# Patient Record
Sex: Female | Born: 1985 | Race: White | Hispanic: No | Marital: Married | Smoking: Never smoker
Health system: Southern US, Community
[De-identification: ages and names within clinical notes are randomized; demographics above are authoritative.]

## PROBLEM LIST (undated history)

## (undated) DIAGNOSIS — F419 Anxiety disorder, unspecified: Secondary | ICD-10-CM

---
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
## 2015-08-22 ENCOUNTER — Ambulatory Visit (INDEPENDENT_AMBULATORY_CARE_PROVIDER_SITE_OTHER): Payer: BLUE CROSS/BLUE SHIELD | Admitting: Physician Assistant

## 2015-08-22 ENCOUNTER — Ambulatory Visit (INDEPENDENT_AMBULATORY_CARE_PROVIDER_SITE_OTHER): Payer: BLUE CROSS/BLUE SHIELD

## 2015-08-22 VITALS — BP 150/94 | HR 80 | Temp 98.9°F | Resp 16 | Ht 63.0 in

## 2015-08-22 DIAGNOSIS — S92302A Fracture of unspecified metatarsal bone(s), left foot, initial encounter for closed fracture: Secondary | ICD-10-CM

## 2015-08-22 DIAGNOSIS — M25572 Pain in left ankle and joints of left foot: Secondary | ICD-10-CM | POA: Diagnosis not present

## 2015-08-22 MED ORDER — HYDROCODONE-ACETAMINOPHEN 5-325 MG PO TABS: 1.0000 | ORAL_TABLET | Freq: Four times a day (QID) | ORAL | Status: DC | PRN

## 2015-08-22 NOTE — Patient Instructions (Addendum)
  We will set you up with ortho   IF you received an x-ray today, you will receive an invoice from [facility]. Please contact [facility] at [phone] with questions or concerns regarding your invoice.   IF you received labwork today, you will receive an invoice from [facility]. Please contact [facility] at [phone] with questions or concerns regarding your invoice.   Our billing staff will not be able to assist you with questions regarding bills from these companies.  You will be contacted with the lab results as soon as they are available. The fastest way to get your results is to activate your My Chart account. Instructions are located on the last page of this paperwork. If you have not heard from us regarding the results in 2 weeks, please contact this office.

## 2015-08-22 NOTE — Progress Notes (Signed)
   Heidi Hardin  MRN: [id number] DOB: 1986-05-12  Subjective:  Pt presents to clinic with left foot pain that started about 3 days ago when she twisted her foot.  She was treating with ice and motrin and thought she would get better but she actually feels like the swelling and bruising and definitely the pain is getting worse.  She has been keeping it wrapped and using crutches.  There are no active problems to display for this patient.   No current outpatient prescriptions on file prior to visit.   No current facility-administered medications on file prior to visit.    No Known Allergies  Review of Systems  Musculoskeletal: Positive for gait problem (2nd to pain).   Objective:  BP 150/94 mmHg  Pulse 80  Temp(Src) 98.9 F (37.2 C) (Oral)  Resp 16  Ht 5\' 3"  (1.6 m)  SpO2 97%  LMP 08/01/2015  Physical Exam  Constitutional: She is oriented to person, place, and time and well-developed, well-nourished, and in no distress.  HENT:  Head: Normocephalic and atraumatic.  Right Ear: Hearing and external ear normal.  Left Ear: Hearing and external ear normal.  Eyes: Conjunctivae are normal.  Neck: Normal range of motion.  Pulmonary/Chest: Effort normal.  Musculoskeletal:       Right foot: Normal.       Left foot: There is decreased range of motion, tenderness (lateral mid foot), bony tenderness and swelling (mid foot).       Feet:  Neurological: She is alert and oriented to person, place, and time. Gait normal.  Skin: Skin is warm and dry.  Psychiatric: Mood, memory, affect and judgment normal.  Vitals reviewed.  Dg Foot Complete Left  08/22/2015  CLINICAL DATA:  Rolled foot while walking, left foot pain and swelling EXAM: LEFT FOOT - COMPLETE 3+ VIEW COMPARISON:  None. FINDINGS: Three views of the left foot submitted. There is small avulsion fracture medial aspect at the base of first metatarsal. There is transverse nondisplaced fracture proximal shaft second, third and fourth  metatarsal. IMPRESSION: Small avulsion fracture medial aspect at the base of first metatarsal. Transverse nondisplaced fracture proximal shaft second, third and fourth metatarsal. Electronically Signed   By: Natasha MeadLiviu  Pop M.D.   On: 08/22/2015 17:59    Assessment and Plan :  Pain in joint, ankle and foot, left - Plan: DG Foot Complete Left, Ambulatory referral to Orthopedic Surgery  Fracture of metatarsal, closed, left, initial encounter - Plan: HYDROcodone-acetaminophen (NORCO/VICODIN) 5-325 MG tablet, Ambulatory referral to Orthopedic Surgery  Pt has multiple metatarsal fracture and she needs ortho evaluation.  She was placed in a camwalker and instructed to continue with crutches and non weight bearing  Benny LennertSarah Weber PA-C  [facility] 08/22/2015 6:14 PM

## 2016-10-19 ENCOUNTER — Inpatient Hospital Stay (HOSPITAL_COMMUNITY)
Admission: AD | Admit: 2016-10-19 | Discharge: 2016-10-19 | Disposition: A | Discharge status: Home / Self Care | Payer: PRIVATE HEALTH INSURANCE | Source: Ambulatory Visit | Attending: Family Medicine | Admitting: Family Medicine

## 2016-10-19 ENCOUNTER — Encounter (HOSPITAL_COMMUNITY): Payer: Self-pay

## 2016-10-19 DIAGNOSIS — Z79899 Other long term (current) drug therapy: Secondary | ICD-10-CM | POA: Diagnosis not present

## 2016-10-19 DIAGNOSIS — R102 Pelvic and perineal pain: Secondary | ICD-10-CM | POA: Diagnosis not present

## 2016-10-19 LAB — WET PREP, GENITAL
CLUE CELLS WET PREP: NONE SEEN
Sperm: NONE SEEN
TRICH WET PREP: NONE SEEN
YEAST WET PREP: NONE SEEN

## 2016-10-19 LAB — POCT PREGNANCY, URINE: PREG TEST UR: NEGATIVE

## 2016-10-19 LAB — CBC
HEMATOCRIT: 42 % (ref 36.0–46.0)
Hemoglobin: 14 g/dL (ref 12.0–15.0)
MCH: 28.4 pg (ref 26.0–34.0)
MCHC: 33.3 g/dL (ref 30.0–36.0)
MCV: 85.2 fL (ref 78.0–100.0)
PLATELETS: 219 10*3/uL (ref 150–400)
RBC: 4.93 MIL/uL (ref 3.87–5.11)
RDW: 13.9 % (ref 11.5–15.5)
WBC: 10.2 10*3/uL (ref 4.0–10.5)

## 2016-10-19 LAB — URINALYSIS, ROUTINE W REFLEX MICROSCOPIC
Bilirubin Urine: NEGATIVE
Glucose, UA: NEGATIVE mg/dL
Hgb urine dipstick: NEGATIVE
KETONES UR: NEGATIVE mg/dL
Leukocytes, UA: NEGATIVE
Nitrite: NEGATIVE
PROTEIN: NEGATIVE mg/dL
Specific Gravity, Urine: 1.01 (ref 1.005–1.030)
pH: 5 (ref 5.0–8.0)

## 2016-10-19 NOTE — Discharge Instructions (Signed)
Pelvic Pain, Female Pelvic pain is pain in your lower abdomen, below your belly button and between your hips. The pain may start suddenly (acute), keep coming back (recurring), or last a long time (chronic). Pelvic pain that lasts longer than six months is considered chronic. Pelvic pain may affect your:  Reproductive organs.  Urinary system.  Digestive tract.  Musculoskeletal system. There are many potential causes of pelvic pain. Sometimes, the pain can be a result of digestive or urinary conditions, strained muscles or ligaments, or even reproductive conditions. Sometimes the cause of pelvic pain is not known. Follow these instructions at home:  Take over-the-counter and prescription medicines only as told by your health care provider.  Rest as told by your health care provider.  Do not have sex it if hurts.  Keep a journal of your pelvic pain. Write down:  When the pain started.  Where the pain is located.  What seems to make the pain better or worse, such as food or your menstrual cycle.  Any symptoms you have along with the pain.  Keep all follow-up visits as told by your health care provider. This is important. Contact a health care provider if:  Medicine does not help your pain.  Your pain comes back.  You have new symptoms.  You have abnormal vaginal discharge or bleeding, including bleeding after menopause.  You have a fever or chills.  You are constipated.  You have blood in your urine or stool.  You have foul-smelling urine.  You feel weak or lightheaded. Get help right away if:  You have sudden severe pain.  Your pain gets steadily worse.  You have severe pain along with fever, nausea, vomiting, or excessive sweating.  You lose consciousness. This information is not intended to replace advice given to you by your health care provider. Make sure you discuss any questions you have with your health care provider. Document Released: 04/13/2004  Document Revised: 06/11/2015 Document Reviewed: 03/07/2015 Elsevier Interactive Patient Education  2017 [employer] Inc[employer]  

## 2016-10-19 NOTE — MAU Note (Signed)
Since 0630 this morning, has had sharp pains in RLQ, lasts about 15-3320min, then goes away.  Has happened about every 3 hrs

## 2016-10-19 NOTE — MAU Provider Note (Signed)
History     CSN: [id number]  Arrival date and time: 10/19/16 1813   First Provider Initiated Contact with Patient 10/19/16 1851     31 y.o non-pregnant female here with RLQ pain. Pain started this am. Describes as crampy then becomes sharp and lasts 20 min then resolves. Feels low in pelvic region. It has occurred about every 3 hrs today. She has not used anything for it. Lying down improves pain. Rates 9/10 when it occurs. No fevers. No urinary sx. No vaginal discharge or bleeding. Last BM was yesterday. LMP was about 3 mos ago. No hx of STIs. She is not using contraception and would be ok with a pregnancy.   Past Medical History:  Diagnosis Date  . Medical history non-contributory     Past Surgical History:  Procedure Laterality Date  . MOUTH SURGERY  2000    Family History  Problem Relation Age of Onset  . Cancer Father     Social History  Substance Use Topics  . Smoking status: Never Smoker  . Smokeless tobacco: Never Used  . Alcohol use No    Allergies: No Known Allergies  Prescriptions Prior to Admission  Medication Sig Dispense Refill Last Dose  . ALPRAZolam (XANAX) 0.25 MG tablet Take 0.25 mg by mouth at bedtime as needed for anxiety.     Marland Kitchen. HYDROcodone-acetaminophen (NORCO/VICODIN) 5-325 MG tablet Take 1 tablet by mouth every 6 (six) hours as needed for moderate pain. 30 tablet 0   . sertraline (ZOLOFT) 50 MG tablet Take 75 mg by mouth daily.  0     Review of Systems  Constitutional: Negative for fever.  Gastrointestinal: Positive for abdominal pain (RLQ). Negative for constipation, diarrhea, nausea and vomiting.  Genitourinary: Negative for dysuria, frequency, urgency, vaginal bleeding and vaginal discharge.   Physical Exam   Blood pressure 131/82, pulse 60, temperature 98.4 F (36.9 C), temperature source Oral, resp. rate 18, weight 78 kg (172 lb), last menstrual period 09/28/2016, SpO2 100 %.  Physical Exam  Nursing note and vitals  reviewed. Constitutional: She is oriented to person, place, and time. She appears well-developed and well-nourished. No distress (appears comfortable).  Neck: Normal range of motion.  Cardiovascular: Normal rate.   Respiratory: Effort normal.  GI: Soft. She exhibits no distension and no mass. There is no tenderness. There is no rebound and no guarding.  Genitourinary:  Genitourinary Comments: External: no lesions or erythema Vagina: rugated, nulli, thin white discharge Uterus: non enlarged, anteverted, non tender, no CMT Adnexae: no masses, no tenderness left, no tenderness right   Musculoskeletal: Normal range of motion.  Neurological: She is alert and oriented to person, place, and time.  Skin: Skin is warm and dry.  Psychiatric: She has a normal mood and affect.   Results for orders placed or performed during the hospital encounter of 10/19/16 (from the past 24 hour(s))  Urinalysis, Routine w reflex microscopic     Status: Abnormal   Collection Time: 10/19/16  6:42 PM  Result Value Ref Range   Color, Urine YELLOW YELLOW   APPearance CLEAR CLEAR   Specific Gravity, Urine 1.010 1.005 - 1.030   pH 5.0 5.0 - 8.0   Glucose, UA NEGATIVE NEGATIVE mg/dL   Hgb urine dipstick NEGATIVE NEGATIVE   Bilirubin Urine NEGATIVE NEGATIVE   Ketones, ur NEGATIVE NEGATIVE mg/dL   Protein, ur NEGATIVE NEGATIVE mg/dL   Nitrite NEGATIVE NEGATIVE   Leukocytes, UA NEGATIVE NEGATIVE   RBC / HPF 0-5 0 - 5 RBC/hpf  WBC, UA 0-5 0 - 5 WBC/hpf   Bacteria, UA MANY (A) NONE SEEN   Squamous Epithelial / LPF 0-5 (A) NONE SEEN   Mucous PRESENT   Pregnancy, urine POC     Status: None   Collection Time: 10/19/16  6:45 PM  Result Value Ref Range   Preg Test, Ur NEGATIVE NEGATIVE  Wet prep, genital     Status: Abnormal   Collection Time: 10/19/16  7:00 PM  Result Value Ref Range   Yeast Wet Prep HPF POC NONE SEEN NONE SEEN   Trich, Wet Prep NONE SEEN NONE SEEN   Clue Cells Wet Prep HPF POC NONE SEEN NONE  SEEN   WBC, Wet Prep HPF POC FEW (A) NONE SEEN   Sperm NONE SEEN   CBC     Status: None   Collection Time: 10/19/16  7:14 PM  Result Value Ref Range   WBC 10.2 4.0 - 10.5 K/uL   RBC 4.93 3.87 - 5.11 MIL/uL   Hemoglobin 14.0 12.0 - 15.0 g/dL   HCT 40.9 81.1 - 91.4 %   MCV 85.2 78.0 - 100.0 fL   MCH 28.4 26.0 - 34.0 pg   MCHC 33.3 30.0 - 36.0 g/dL   RDW 78.2 95.6 - 21.3 %   Platelets 219 150 - 400 K/uL   MAU Course  Procedures Declines pain meds  MDM Labs ordered and reviewed. No evidence of acute pelvic or abdominal process. Pain could have GI etiology, could be caused by small ovarian cysts that are undetectable on exam, or could be r/t impending menstrual cycle. She is stable for discharge home.  Assessment and Plan   1. Pelvic pain in female    Discharge home Follow up with GYN provider of choice to establish care Aleve prn Return for worsening sx Start PNV or Folic acid 1mg  daily in event of pregnancy  Allergies as of 10/19/2016   No Known Allergies     Medication List    TAKE these medications   ALPRAZolam 0.25 MG tablet Commonly known as:  XANAX Take 0.25 mg by mouth at bedtime as needed for anxiety.   buPROPion 150 MG 12 hr tablet Commonly known as:  WELLBUTRIN SR Take 150 mg by mouth daily.      Donette Larry, CNM 10/19/2016, 7:08 PM

## 2016-10-20 LAB — GC/CHLAMYDIA PROBE AMP (~~LOC~~) NOT AT ARMC
CHLAMYDIA, DNA PROBE: NEGATIVE
NEISSERIA GONORRHEA: NEGATIVE

## 2017-01-19 ENCOUNTER — Emergency Department (HOSPITAL_COMMUNITY)
Admission: EM | Admit: 2017-01-19 | Discharge: 2017-01-19 | Disposition: A | Discharge status: Home / Self Care | Payer: PRIVATE HEALTH INSURANCE | Attending: Emergency Medicine | Admitting: Emergency Medicine

## 2017-01-19 ENCOUNTER — Emergency Department (HOSPITAL_COMMUNITY): Payer: PRIVATE HEALTH INSURANCE

## 2017-01-19 ENCOUNTER — Encounter (HOSPITAL_COMMUNITY): Payer: Self-pay | Admitting: Emergency Medicine

## 2017-01-19 DIAGNOSIS — Z23 Encounter for immunization: Secondary | ICD-10-CM | POA: Insufficient documentation

## 2017-01-19 DIAGNOSIS — Y999 Unspecified external cause status: Secondary | ICD-10-CM | POA: Diagnosis not present

## 2017-01-19 DIAGNOSIS — S61212A Laceration without foreign body of right middle finger without damage to nail, initial encounter: Secondary | ICD-10-CM | POA: Diagnosis present

## 2017-01-19 DIAGNOSIS — Z79899 Other long term (current) drug therapy: Secondary | ICD-10-CM | POA: Insufficient documentation

## 2017-01-19 DIAGNOSIS — W268XXA Contact with other sharp object(s), not elsewhere classified, initial encounter: Secondary | ICD-10-CM | POA: Insufficient documentation

## 2017-01-19 DIAGNOSIS — Y929 Unspecified place or not applicable: Secondary | ICD-10-CM | POA: Insufficient documentation

## 2017-01-19 DIAGNOSIS — Y93G1 Activity, food preparation and clean up: Secondary | ICD-10-CM | POA: Insufficient documentation

## 2017-01-19 MED ORDER — BACITRACIN ZINC 500 UNIT/GM EX OINT
1.0000 "application " | TOPICAL_OINTMENT | Freq: Once | CUTANEOUS | Status: AC
  Administered 2017-01-19: 1 via TOPICAL
  Filled 2017-01-19: qty 0.9

## 2017-01-19 MED ORDER — TETANUS-DIPHTH-ACELL PERTUSSIS 5-2.5-18.5 LF-MCG/0.5 IM SUSP
0.5000 mL | Freq: Once | INTRAMUSCULAR | Status: AC
  Administered 2017-01-19: 0.5 mL via INTRAMUSCULAR
  Filled 2017-01-19: qty 0.5

## 2017-01-19 MED ORDER — LIDOCAINE HCL (PF) 1 % IJ SOLN
10.0000 mL | Freq: Once | INTRAMUSCULAR | Status: AC
  Administered 2017-01-19: 10 mL via INTRADERMAL
  Filled 2017-01-19: qty 30

## 2017-01-19 NOTE — Discharge Instructions (Signed)
Used the finger splint for the first 2-3 days.  Keep wound dry and do not remove dressing for 24 hours if possible. After that, wash gently morning and night (every 12 hours) with soap and water. Use a topical antibiotic ointment and cover with a bandaid or gauze.    Do NOT use rubbing alcohol or hydrogen peroxide, do not soak the area   Present to your primary care doctor or the urgent care of your choice, or the ED for suture removal in 7-8 days.   Every attempt was made to remove foreign body (contaminants) from the wound.  However, there is always a chance that some may remain in the wound. This can  increase your risk of infection.   If you see signs of infection (warmth, redness, tenderness, pus, sharp increase in pain, fever, red streaking in the skin) immediately return to the emergency department.   After the wound heals fully, apply sunscreen for 6-12 months to minimize scarring.

## 2017-01-19 NOTE — ED Notes (Signed)
Applied finger with saline gauze and wrapped it.

## 2017-01-19 NOTE — ED Provider Notes (Signed)
[facility]-EMERGENCY DEPT Provider Note   CSN: [id number] Arrival date & time: 01/19/17  6295     History   Chief Complaint Chief Complaint  Patient presents with  . Extremity Laceration    HPI  Blood pressure (!) 132/97, pulse 87, temperature 98.1 F (36.7 C), temperature source Oral, resp. rate 16, height 5\' 3"  (1.6 m), weight 72.6 kg (160 lb), last menstrual period 01/18/2017, SpO2 100 %.  Heidi Hardin is a 31 y.o. female complaining of  Laceration to right middle digit PIP occurring just prior to arrival when patient inserted her hand into her blender while she was making a smoothie earlier in the morning. The bladder was not on. She is unsure of her last tetanus shot date. 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 Medical history non-contributory     There are no active problems to display for this patient.   Past Surgical History:  Procedure Laterality Date  . MOUTH SURGERY  2000    OB History    No data available       Home Medications    Prior to Admission medications   Medication Sig Start Date End Date Taking? Authorizing Provider  ALPRAZolam Prudy Feeler) 0.25 MG tablet Take 0.25 mg by mouth at bedtime as needed for anxiety.    [provider]  buPROPion (WELLBUTRIN SR) 150 MG 12 hr tablet Take 150 mg by mouth daily. 09/19/16   [provider]    Family History Family History  Problem Relation Age of Onset  . Cancer Father     Social History Social History  Substance Use Topics  . Smoking status: Never Smoker  . Smokeless tobacco: Never Used  . Alcohol use No     Allergies   Patient has no known allergies.   Review of Systems Review of Systems  A complete review of systems was obtained and all systems are negative except as  noted in the HPI and PMH.   Physical Exam Updated Vital Signs BP (!) 132/97 (BP Location: Left Arm)   Pulse 87   Temp 98.1 F (36.7 C) (Oral)   Resp 16   Ht 5\' 3"  (1.6 m)   Wt 72.6 kg (160 lb)   LMP 01/18/2017   SpO2 100%   BMI 28.34 kg/m   Physical Exam  Constitutional: She is oriented to person, place, and time. She appears well-developed and well-nourished. No distress.  HENT:  Head: Normocephalic and atraumatic.  Mouth/Throat: Oropharynx is clear and moist.  Eyes: Pupils are equal, round, and reactive to light. Conjunctivae and EOM are normal.  Neck: Normal range of motion.  Cardiovascular: Normal rate, regular rhythm and intact distal pulses.   Pulmonary/Chest: Effort normal and breath sounds normal.  Abdominal: Soft. There is no tenderness.  Musculoskeletal: Normal range of motion.  Neurological: She is alert and oriented to person, place, and time.  Skin: She is not diaphoretic.  1 cm full-thickness laceration to right middle digit PIP on the dorsal aspect. Neurovacularly intact with full ROM and strength to each interphalangeal joint (tested in isolation) in both flexion and extension.    Psychiatric: She has a normal mood and affect.  Nursing note and vitals reviewed.    ED Treatments / Results  Labs (all labs ordered are listed, but only abnormal results  are displayed) Labs Reviewed - No data to display  EKG  EKG Interpretation None       Radiology Dg Finger Index Right  Result Date: 01/19/2017 CLINICAL DATA:  Finger caught bladder, initial encounter EXAM: RIGHT THIRD FINGER COMPARISON:  None. FINDINGS: There is no evidence of fracture or dislocation. There is no evidence of arthropathy or other focal bone abnormality. Soft tissues are unremarkable. IMPRESSION: No acute abnormality noted. Electronically Signed   By: Alcide Clever M.D.   On: 01/19/2017 07:21    Procedures .Marland KitchenLaceration Repair Date/Time: 01/19/2017 8:47 AM Performed by: Wynetta Emery Authorized by: Wynetta Emery   Consent:    Consent obtained:  Verbal   Alternatives discussed:  No treatment Anesthesia (see MAR for exact dosages):    Anesthesia method:  Nerve block   Block needle gauge:  25 G   Block anesthetic:  Lidocaine 1% w/o epi   Block injection procedure:  Anatomic landmarks identified   Block outcome:  Anesthesia achieved Laceration details:    Location:  Finger   Finger location:  R long finger Repair type:    Repair type:  Simple Pre-procedure details:    Preparation:  Patient was prepped and draped in usual sterile fashion Exploration:    Wound exploration: wound explored through full range of motion and entire depth of wound probed and visualized     Contaminated: no   Treatment:    Area cleansed with:  Saline   Amount of cleaning:  Standard   Irrigation solution:  Sterile water and sterile saline   Irrigation volume:  500cc   Irrigation method:  Syringe   Visualized foreign bodies/material removed: no   Skin repair:    Repair method:  Sutures   Suture size:  6-0   Wound skin closure material used: ethylon.   Suture technique:  Simple interrupted   Number of sutures:  3 Approximation:    Approximation:  Close   Vermilion border: well-aligned   Post-procedure details:    Dressing:  Antibiotic ointment and splint for protection   Patient tolerance of procedure:  Tolerated well, no immediate complications   (including critical care time)  Medications Ordered in ED Medications  bacitracin ointment 1 application (not administered)  lidocaine (PF) (XYLOCAINE) 1 % injection 10 mL (10 mLs Intradermal Given 01/19/17 0814)  Tdap (BOOSTRIX) injection 0.5 mL (0.5 mLs Intramuscular Given 01/19/17 0811)     Initial Impression / Assessment and Plan / ED Course  I have reviewed the triage vital signs and the nursing notes.  Pertinent labs & imaging results that were available during my care of the patient were reviewed by me and  considered in my medical decision making (see chart for details).     Vitals:   01/19/17 0659  BP: (!) 132/97  Pulse: 87  Resp: 16  Temp: 98.1 F (36.7 C)  TempSrc: Oral  SpO2: 100%  Weight: 72.6 kg (160 lb)  Height: 5\' 3"  (1.6 m)    Medications  bacitracin ointment 1 application (not administered)  lidocaine (PF) (XYLOCAINE) 1 % injection 10 mL (10 mLs Intradermal Given 01/19/17 0814)  Tdap (BOOSTRIX) injection 0.5 mL (0.5 mLs Intramuscular Given 01/19/17 0811)    Ahlana Slaydon is 31 y.o. female presenting with finger laceration.  No signs of tendon/joint involvement. Tdap booster given. Pressure irrigation performed. Laceration occurred < 8 hours prior to repair which was well tolerated. Pt has no co morbidities to effect normal wound healing. Discussed suture home care w pt  and answered questions. Pt to f-u for wound check and suture removal in 7 days. Pt is hemodynamically stable with no complaints prior to dc.   Evaluation does not show pathology that would require ongoing emergent intervention or inpatient treatment. Pt is hemodynamically stable and mentating appropriately. Discussed findings and plan with patient/guardian, who agrees with care plan. All questions answered. Return precautions discussed and outpatient follow up given.    Final Clinical Impressions(s) / ED Diagnoses   Final diagnoses:  Laceration of right middle finger without foreign body without damage to nail, initial encounter    New Prescriptions New Prescriptions   No medications on file     Kaylyn Lim 01/19/17 0850    Lorre Nick, MD 01/19/17 [phone]

## 2017-01-19 NOTE — ED Triage Notes (Signed)
Patient sliced her finger when making a smoothie around 20 minutes ago. Patient is having 1/10 pain. Patient had gauze around it. Gauze had moderate amount on the gauze.

## 2017-02-16 ENCOUNTER — Inpatient Hospital Stay (EMERGENCY_DEPARTMENT_HOSPITAL)
Admission: AD | Admit: 2017-02-16 | Discharge: 2017-02-16 | Disposition: A | Discharge status: Home / Self Care | Payer: PRIVATE HEALTH INSURANCE | Source: Ambulatory Visit | Attending: Obstetrics & Gynecology | Admitting: Obstetrics & Gynecology

## 2017-02-16 ENCOUNTER — Emergency Department (HOSPITAL_COMMUNITY)
Admission: EM | Admit: 2017-02-16 | Discharge: 2017-02-16 | Disposition: A | Discharge status: Home / Self Care | Payer: PRIVATE HEALTH INSURANCE | Source: Home / Self Care | Attending: Emergency Medicine | Admitting: Emergency Medicine

## 2017-02-16 ENCOUNTER — Emergency Department (HOSPITAL_COMMUNITY): Payer: PRIVATE HEALTH INSURANCE

## 2017-02-16 ENCOUNTER — Encounter (HOSPITAL_COMMUNITY): Payer: Self-pay | Admitting: Emergency Medicine

## 2017-02-16 ENCOUNTER — Encounter (HOSPITAL_COMMUNITY): Payer: Self-pay

## 2017-02-16 DIAGNOSIS — N839 Noninflammatory disorder of ovary, fallopian tube and broad ligament, unspecified: Secondary | ICD-10-CM | POA: Diagnosis not present

## 2017-02-16 DIAGNOSIS — N83511 Torsion of right ovary and ovarian pedicle: Secondary | ICD-10-CM | POA: Diagnosis not present

## 2017-02-16 DIAGNOSIS — N838 Other noninflammatory disorders of ovary, fallopian tube and broad ligament: Secondary | ICD-10-CM

## 2017-02-16 DIAGNOSIS — R102 Pelvic and perineal pain: Secondary | ICD-10-CM | POA: Diagnosis not present

## 2017-02-16 DIAGNOSIS — Z79899 Other long term (current) drug therapy: Secondary | ICD-10-CM

## 2017-02-16 DIAGNOSIS — O0281 Inappropriate change in quantitative human chorionic gonadotropin (hCG) in early pregnancy: Secondary | ICD-10-CM | POA: Insufficient documentation

## 2017-02-16 DIAGNOSIS — R1031 Right lower quadrant pain: Secondary | ICD-10-CM

## 2017-02-16 HISTORY — DX: Anxiety disorder, unspecified: F41.9

## 2017-02-16 LAB — URINALYSIS, ROUTINE W REFLEX MICROSCOPIC
BILIRUBIN URINE: NEGATIVE
Bacteria, UA: NONE SEEN
GLUCOSE, UA: NEGATIVE mg/dL
KETONES UR: 80 mg/dL — AB
LEUKOCYTES UA: NEGATIVE
Nitrite: NEGATIVE
PH: 7 (ref 5.0–8.0)
PROTEIN: NEGATIVE mg/dL
Specific Gravity, Urine: 1.013 (ref 1.005–1.030)

## 2017-02-16 LAB — COMPREHENSIVE METABOLIC PANEL
ALT: 17 U/L (ref 14–54)
ANION GAP: 9 (ref 5–15)
AST: 21 U/L (ref 15–41)
Albumin: 4.5 g/dL (ref 3.5–5.0)
Alkaline Phosphatase: 67 U/L (ref 38–126)
BUN: 7 mg/dL (ref 6–20)
CHLORIDE: 107 mmol/L (ref 101–111)
CO2: 23 mmol/L (ref 22–32)
Calcium: 9.5 mg/dL (ref 8.9–10.3)
Creatinine, Ser: 0.83 mg/dL (ref 0.44–1.00)
Glucose, Bld: 112 mg/dL — ABNORMAL HIGH (ref 65–99)
POTASSIUM: 3.9 mmol/L (ref 3.5–5.1)
SODIUM: 139 mmol/L (ref 135–145)
Total Bilirubin: 0.9 mg/dL (ref 0.3–1.2)
Total Protein: 7.5 g/dL (ref 6.5–8.1)

## 2017-02-16 LAB — CBC
HCT: 39.8 % (ref 36.0–46.0)
HEMOGLOBIN: 14 g/dL (ref 12.0–15.0)
MCH: 28.7 pg (ref 26.0–34.0)
MCHC: 35.2 g/dL (ref 30.0–36.0)
MCV: 81.6 fL (ref 78.0–100.0)
Platelets: 241 10*3/uL (ref 150–400)
RBC: 4.88 MIL/uL (ref 3.87–5.11)
RDW: 13 % (ref 11.5–15.5)
WBC: 16.2 10*3/uL — AB (ref 4.0–10.5)

## 2017-02-16 LAB — HCG, QUANTITATIVE, PREGNANCY

## 2017-02-16 LAB — LIPASE, BLOOD: Lipase: 29 U/L (ref 11–51)

## 2017-02-16 MED ORDER — MORPHINE SULFATE (PF) 4 MG/ML IV SOLN
4.0000 mg | Freq: Once | INTRAVENOUS | Status: AC
  Administered 2017-02-16: 4 mg via INTRAVENOUS
  Filled 2017-02-16: qty 1

## 2017-02-16 MED ORDER — IOPAMIDOL (ISOVUE-300) INJECTION 61%
100.0000 mL | Freq: Once | INTRAVENOUS | Status: AC | PRN
  Administered 2017-02-16: 100 mL via INTRAVENOUS

## 2017-02-16 MED ORDER — NAPROXEN 500 MG PO TABS: 500.0000 mg | ORAL_TABLET | Freq: Two times a day (BID) | ORAL | 0 refills | Status: DC

## 2017-02-16 MED ORDER — MORPHINE SULFATE (PF) 4 MG/ML IV SOLN
4.0000 mg | Freq: Once | INTRAVENOUS | Status: AC
Start: 2017-02-16 — End: 2017-02-16
  Administered 2017-02-16: 4 mg via INTRAVENOUS
  Filled 2017-02-16: qty 1

## 2017-02-16 MED ORDER — KETOROLAC TROMETHAMINE 30 MG/ML IJ SOLN
30.0000 mg | Freq: Once | INTRAMUSCULAR | Status: AC
  Administered 2017-02-16: 30 mg via INTRAVENOUS
  Filled 2017-02-16: qty 1

## 2017-02-16 MED ORDER — IOPAMIDOL (ISOVUE-300) INJECTION 61%
INTRAVENOUS | Status: AC
  Filled 2017-02-16: qty 100

## 2017-02-16 MED ORDER — ONDANSETRON HCL 4 MG/2ML IJ SOLN
4.0000 mg | Freq: Once | INTRAMUSCULAR | Status: AC | PRN
  Administered 2017-02-16: 4 mg via INTRAVENOUS
  Filled 2017-02-16 (×2): qty 2

## 2017-02-16 MED ORDER — OXYCODONE HCL 5 MG PO TABS: 5.0000 mg | ORAL_TABLET | ORAL | 0 refills | Status: DC | PRN

## 2017-02-16 MED ORDER — ACETAMINOPHEN 500 MG PO TABS
1000.0000 mg | ORAL_TABLET | Freq: Once | ORAL | Status: AC
  Administered 2017-02-16: 1000 mg via ORAL
  Filled 2017-02-16: qty 2

## 2017-02-16 MED ORDER — OXYCODONE HCL 5 MG PO TABS
5.0000 mg | ORAL_TABLET | ORAL | Status: AC
  Administered 2017-02-16: 5 mg via ORAL
  Filled 2017-02-16: qty 1

## 2017-02-16 MED ORDER — ACETAMINOPHEN 500 MG PO TABS: 1000.0000 mg | ORAL_TABLET | Freq: Four times a day (QID) | ORAL | 0 refills | Status: DC | PRN

## 2017-02-16 MED ORDER — ONDANSETRON HCL 4 MG/2ML IJ SOLN
4.0000 mg | Freq: Once | INTRAMUSCULAR | Status: AC
  Administered 2017-02-16: 4 mg via INTRAVENOUS

## 2017-02-16 MED ORDER — HYDROMORPHONE HCL 1 MG/ML IJ SOLN
1.0000 mg | Freq: Once | INTRAMUSCULAR | Status: AC
  Administered 2017-02-16: 1 mg via INTRAMUSCULAR
  Filled 2017-02-16: qty 1

## 2017-02-16 NOTE — ED Notes (Signed)
Patient has went to ultrasound.

## 2017-02-16 NOTE — ED Notes (Signed)
Patient has called out reporting pain is starting to come back. Informed Dr. Casimer Lanius and new orders obtained.

## 2017-02-16 NOTE — ED Notes (Signed)
Ultrasound has called to inform that patient is tearful and requesting additional pain medication. Informed Dr. Cordelia Poche of this information and new order obtained.

## 2017-02-16 NOTE — ED Triage Notes (Signed)
Patient presents with RLQ pain since 1930 last night. Pain has since progressed to severe this morning. Difficult to move. N/V. Denies diarrhea. Currently menstruating. No pain relief with Ibuprofen.

## 2017-02-16 NOTE — MAU Provider Note (Signed)
History     CSN: [id number]  Arrival date and time: 02/16/17 2013   None     Chief Complaint  Patient presents with  . Abdominal Pain   HPI Heidi Hardin is a 31 y.o. female who presents with abdominal pain. Was seen at [facility] today with same complaint & diagnosed with abdominal mass. CT & ultrasound show 11.3 cm right ovarian mass concerning for neoplasm. Patient states she was told to come here if pain worsened. States the pain is slowly coming back; currently rates pain 4/10. Denies fever/chills, n/v/d, constipation, or dysuria. Endorses vaginal bleeding consistent with her menses that started a few days ago.   Past Medical History:  Diagnosis Date  . Anxiety     Past Surgical History:  Procedure Laterality Date  . MOUTH SURGERY  2000    Family History  Problem Relation Age of Onset  . Cancer Father     Social History  Substance Use Topics  . Smoking status: Never Smoker  . Smokeless tobacco: Never Used  . Alcohol use No    Allergies: No Known Allergies  Prescriptions Prior to Admission  Medication Sig Dispense Refill Last Dose  . buPROPion (WELLBUTRIN SR) 150 MG 12 hr tablet Take 150 mg by mouth daily.  5 02/15/2017 at Unknown time  . acetaminophen (TYLENOL) 500 MG tablet Take 2 tablets (1,000 mg total) by mouth every 6 (six) hours as needed. 30 tablet 0   . ALPRAZolam (XANAX) 0.25 MG tablet Take 0.25 mg by mouth at bedtime as needed for anxiety.   Past Week at Unknown time  . naproxen (NAPROSYN) 500 MG tablet Take 1 tablet (500 mg total) by mouth 2 (two) times daily. 30 tablet 0   . oxyCODONE (ROXICODONE) 5 MG immediate release tablet Take 1 tablet (5 mg total) by mouth every 4 (four) hours as needed for severe pain. 25 tablet 0     Review of Systems  Constitutional: Negative.   Gastrointestinal: Positive for abdominal pain. Negative for constipation, diarrhea, nausea and vomiting.  Genitourinary: Positive for vaginal bleeding. Negative for dysuria and vaginal  discharge.   Physical Exam   Blood pressure 120/87, pulse 76, temperature 98.2 F (36.8 C), temperature source Oral, resp. rate 19, height  (1.6 m), weight 169 lb (76.7 kg), last menstrual period 02/14/2017, SpO2 97 %.  Physical Exam  Nursing note and vitals reviewed. Constitutional: She is oriented to person, place, and time. She appears well-developed and well-nourished. No distress.  HENT:  Head: Normocephalic and atraumatic.  Eyes: Conjunctivae are normal. Right eye exhibits no discharge. Left eye exhibits no discharge. No scleral icterus.  Neck: Normal range of motion.  Respiratory: Effort normal. No respiratory distress.  GI: Soft. Bowel sounds are normal. She exhibits mass (mid lower abdomen). There is tenderness in the suprapubic area. There is no rigidity, no rebound and no guarding.  Neurological: She is alert and oriented to person, place, and time.  Skin: Skin is warm and dry. She is not diaphoretic.  Psychiatric: She has a normal mood and affect. Her behavior is normal. Judgment and thought content normal.    MAU Course  Procedures US Pelvis Transvanginal Non-ob (tv Only)  Result Date: 02/16/2017 CLINICAL DATA:  Right lower quadrant pain since last night. Abnormal CT. EXAM: TRANSABDOMINAL AND TRANSVAGINAL ULTRASOUND OF PELVIS TECHNIQUE: Both transabdominal and transvaginal ultrasound examinations of the pelvis were performed. Transabdominal technique was performed for global imaging of the pelvis including uterus, ovaries, adnexal regions, and pelvic cul-de-sac.  It was necessary to proceed with endovaginal exam following the transabdominal exam to visualize the adnexal structures to an adequate degree. COMPARISON:  CT abdomen from earlier same day. FINDINGS: Uterus Measurements: 7.8 x 3.3 x 4.5 cm. No fibroids or other mass visualized. Endometrium Thickness: 11 mm.  No focal abnormality visualized. Right ovary Complex cystic and solid mass within the right adnexa extending  to the midline, measuring 11.6 x 7.6 x 10.8 cm, corresponding to the complex cystic and solid mass seen on earlier CT. A normal right ovary is not seen separate from this mass. Left ovary Measurements: 3 x 2.3 x 2.7 cm. Normal appearance/no adnexal mass. Other findings No abnormal free fluid. IMPRESSION: 1. Complex cystic and solid mass within the right adnexa extending to the midline, measuring 11.6 cm greatest dimension, corresponding to the complex cystic and solid mass seen above the uterus on CT exam performed earlier today. 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 Recommend GYN consultation for further workup considerations including possible laparoscopy. 2. Uterus and left ovary appear normal. Electronically Signed   By: Bary Richard M.D.   On: 02/16/2017 16:52   US Pelvis (transabdominal Only)  Result Date: 02/16/2017 CLINICAL DATA:  Right lower quadrant pain since last night. Abnormal CT. EXAM: TRANSABDOMINAL AND TRANSVAGINAL ULTRASOUND OF PELVIS TECHNIQUE: Both transabdominal and transvaginal ultrasound examinations of the pelvis were performed. Transabdominal technique was performed for global imaging of the pelvis including uterus, ovaries, adnexal regions, and pelvic cul-de-sac. It was necessary to proceed with endovaginal exam following the transabdominal exam to visualize the adnexal structures to an adequate degree. COMPARISON:  CT abdomen from earlier same day. FINDINGS: Uterus Measurements: 7.8 x 3.3 x 4.5 cm. No fibroids or other mass visualized. Endometrium Thickness: 11 mm.  No focal abnormality visualized. Right ovary Complex cystic and solid mass within the right adnexa extending to the midline, measuring 11.6 x 7.6 x 10.8 cm, corresponding to the complex cystic and solid mass seen on earlier CT. A normal right ovary is not seen separate from this mass. Left ovary Measurements: 3 x 2.3 x 2.7 cm. Normal appearance/no adnexal mass. Other findings  No abnormal free fluid. IMPRESSION: 1. Complex cystic and solid mass within the right adnexa extending to the midline, measuring 11.6 cm greatest dimension, corresponding to the complex cystic and solid mass seen above the uterus on CT exam performed earlier today. 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 Recommend GYN consultation for further workup considerations including possible laparoscopy. 2. Uterus and left ovary appear normal. Electronically Signed   By: Bary Richard M.D.   On: 02/16/2017 16:52   Ct Abdomen Pelvis W Contrast  Result Date: 02/16/2017 CLINICAL DATA:  Abdominal distention, right lower quadrant pain EXAM: CT ABDOMEN AND PELVIS WITH CONTRAST TECHNIQUE: Multidetector CT imaging of the abdomen and pelvis was performed using the standard protocol following bolus administration of intravenous contrast. CONTRAST:  ISOVUE-300 IOPAMIDOL (ISOVUE-300) INJECTION 61% COMPARISON:  None. FINDINGS: Lower chest: Lung bases are clear. No effusions. Heart is normal size. Hepatobiliary: No focal hepatic abnormality. Gallbladder unremarkable. Pancreas: No focal abnormality or ductal dilatation. Spleen: No focal abnormality.  Normal size. Adrenals/Urinary Tract: No adrenal abnormality. No focal renal abnormality. No stones or hydronephrosis. Urinary bladder is unremarkable. Stomach/Bowel: Colon is fluid-filled with air-fluid levels suggests the possibility of diarrhea. Normal appendix. Stomach and small bowel decompressed, unremarkable. Vascular/Lymphatic: No evidence of aneurysm or adenopathy. Reproductive: Uterus and left ovary unremarkable. There is a large  complex mixed solid and cystic mass in the midline of the pelvis measuring 11.2 x 10.3 x 8.2 cm. Legrand Rams this represents complex mass within the right ovary. Other: Small amount of free fluid in the pelvis.  No free air. Musculoskeletal: No acute bony abnormality. IMPRESSION: Large complex cystic and solid  mass in the midline of the pelvis measuring 11.2 x 10.3 x 8.2 cm. I favor this represents the right ovary. Cannot exclude cystic ovarian neoplasm, benign or malignant. Small amount of free fluid in the pelvis. Appendix is normal. Fluid filled prominent colon with air-fluid levels suggesting diarrhea. Electronically Signed   By: Charlett Nose M.D.   On: 02/16/2017 14:32   Results for orders placed or performed during the hospital encounter of 02/16/17 (from the past 24 hour(s))  Lipase, blood     Status: None   Collection Time: 02/16/17 12:33 PM  Result Value Ref Range   Lipase 29 11 - 51 U/L  Comprehensive metabolic panel     Status: Abnormal   Collection Time: 02/16/17 12:33 PM  Result Value Ref Range   Sodium 139 135 - 145 mmol/L   Potassium 3.9 3.5 - 5.1 mmol/L   Chloride 107 101 - 111 mmol/L   CO2 23 22 - 32 mmol/L   Glucose, Bld 112 (H) 65 - 99 mg/dL   BUN 7 6 - 20 mg/dL   Creatinine, Ser 6.96 0.44 - 1.00 mg/dL   Calcium 9.5 8.9 - 29.5 mg/dL   Total Protein 7.5 6.5 - 8.1 g/dL   Albumin 4.5 3.5 - 5.0 g/dL   AST 21 15 - 41 U/L   ALT 17 14 - 54 U/L   Alkaline Phosphatase 67 38 - 126 U/L   Total Bilirubin 0.9 0.3 - 1.2 mg/dL   GFR calc non Af Amer >60 >60 mL/min   GFR calc Af Amer >60 >60 mL/min   Anion gap 9 5 - 15  CBC     Status: Abnormal   Collection Time: 02/16/17 12:33 PM  Result Value Ref Range   WBC 16.2 (H) 4.0 - 10.5 K/uL   RBC 4.88 3.87 - 5.11 MIL/uL   Hemoglobin 14.0 12.0 - 15.0 g/dL   HCT 28.4 13.2 - 44.0 %   MCV 81.6 78.0 - 100.0 fL   MCH 28.7 26.0 - 34.0 pg   MCHC 35.2 30.0 - 36.0 g/dL   RDW 10.2 72.5 - 36.6 %   Platelets 241 150 - 400 K/uL  Urinalysis, Routine w reflex microscopic     Status: Abnormal   Collection Time: 02/16/17 12:33 PM  Result Value Ref Range   Color, Urine YELLOW YELLOW   APPearance CLEAR CLEAR   Specific Gravity, Urine 1.013 1.005 - 1.030   pH 7.0 5.0 - 8.0   Glucose, UA NEGATIVE NEGATIVE mg/dL   Hgb urine dipstick MODERATE (A)  NEGATIVE   Bilirubin Urine NEGATIVE NEGATIVE   Ketones, ur 80 (A) NEGATIVE mg/dL   Protein, ur NEGATIVE NEGATIVE mg/dL   Nitrite NEGATIVE NEGATIVE   Leukocytes, UA NEGATIVE NEGATIVE   RBC / HPF 0-5 0 - 5 RBC/hpf   WBC, UA 0-5 0 - 5 WBC/hpf   Bacteria, UA NONE SEEN NONE SEEN   Squamous Epithelial / LPF 0-5 (A) NONE SEEN   Mucus PRESENT   hCG, quantitative, pregnancy     Status: None   Collection Time: 02/16/17 12:53 PM  Result Value Ref Range   hCG, Beta Chain, Quant, S <1 <5 mIU/mL  MDM Negative BHCG at [facility] today. Patient had full work up at [facility] including labs, abdominal CT, & pelvic ultrasound. Reports mild increase in pain since being discharged; has not filled rx yet. No concern for torsion at this time. Discussed options with patient; will collect CA-125 today so it will be resulted for her f/u appointment. Pt declines vaginal swabs/cultures. I did not find it necessary to repeat pelvic exam at this time.   Assessment and Plan  A; 1. Ovarian mass, right    P: Discharge home CA-125 pending Msg to [facility] for f/u appt asap vs gyn/onc referral Discussed reasons to return to MAU including s/s ovarian torsion Take meds previously prescribed PRN pain  Judeth Horn 02/16/2017, 10:35 PM

## 2017-02-16 NOTE — ED Provider Notes (Signed)
Received care of patient at 4PM. Briefly this is a 31yo female who presents with RLQ abdominal pain. CT shows concern for right ovarian mass. [place] ordered and pending. Preg negative, no vaginal discharge, doubt TOA.   [place] results confirm presence of large cystic and solid ovarian mass.  Noted that dopplers not ordered on this [place], however clinically on my evaluation she appears comfortable and I have a low suspicion for torsion at this time.  Discussed with patient that she will require close OBGYN follow up, to call the office tomorrow to set up appointment per Dr. Forestine Chute GYN recommendations.  Discussed strict return precautions, discussing that if she develops acute severe writhing pain this may be concerning for torsion, and she should proceed to any ED however [facility] appropriate if torsion is concern.  She states understanding. Provided rx for oxycodone, naproxen, tylenol, discussed risks of narcotic medications in detail. Patient discharged in stable condition with understanding of reasons to return.    Heidi Monday, MD 02/17/17 1322

## 2017-02-16 NOTE — ED Notes (Signed)
Patient transported to CT 

## 2017-02-16 NOTE — MAU Note (Addendum)
Pt states she just left [facility]. States they did an ultrasound and it was not the correct one and was told to come here. Was told that she has a cyst on right ovary. States she has been in pain since 7:30pm. States the pain is throbbing and sharp. Rates 3/10 at this time. Pt states she is currently on her period.

## 2017-02-16 NOTE — ED Provider Notes (Signed)
[facility]-EMERGENCY DEPT Provider Note   CSN: [id number] Arrival date & time: 02/16/17  1112     History   Chief Complaint Chief Complaint  Patient presents with  . Abdominal Pain    HPI Heidi Hardin is a 31 y.o. female.  31 year old female presents with a 24-hour history of right lower quadrant abdominal pain without radiation to her flank. Denies any fever or chills. No urinary symptoms. She is on her menstrual cycle at this time. Pain is been persistent and worse with any movement. No prior history of same. No treatment use prior to arrival.      Past Medical History:  Diagnosis Date  . Medical history non-contributory     There are no active problems to display for this patient.   Past Surgical History:  Procedure Laterality Date  . MOUTH SURGERY  2000    OB History    No data available       Home Medications    Prior to Admission medications   Medication Sig Start Date End Date Taking? Authorizing Provider  ALPRAZolam Prudy Feeler) 0.25 MG tablet Take 0.25 mg by mouth at bedtime as needed for anxiety.    [provider]  buPROPion (WELLBUTRIN SR) 150 MG 12 hr tablet Take 150 mg by mouth daily. 09/19/16   [provider]    Family History Family History  Problem Relation Age of Onset  . Cancer Father     Social History Social History  Substance Use Topics  . Smoking status: Never Smoker  . Smokeless tobacco: Never Used  . Alcohol use No     Allergies   Patient has no known allergies.   Review of Systems Review of Systems  All other systems reviewed and are negative.    Physical Exam Updated Vital Signs BP (!) 142/95   Pulse (!) 45   Temp 97.6 F (36.4 C)   Resp 16   LMP 01/18/2017   SpO2 100%   Physical Exam  Constitutional: She is oriented to person, place, and time. She appears well-developed and well-nourished.  Non-toxic appearance. No distress.  HENT:  Head: Normocephalic and atraumatic.  Eyes: Pupils are equal,  round, and reactive to light. Conjunctivae, EOM and lids are normal.  Neck: Normal range of motion. Neck supple. No tracheal deviation present. No thyroid mass present.  Cardiovascular: Normal rate, regular rhythm and normal heart sounds.  Exam reveals no gallop.   No murmur heard. Pulmonary/Chest: Effort normal and breath sounds normal. No stridor. No respiratory distress. She has no decreased breath sounds. She has no wheezes. She has no rhonchi. She has no rales.  Abdominal: Soft. Normal appearance and bowel sounds are normal. She exhibits no distension. There is tenderness in the right lower quadrant. There is guarding. There is no rebound and no CVA tenderness.    Musculoskeletal: Normal range of motion. She exhibits no edema or tenderness.  Neurological: She is alert and oriented to person, place, and time. She has normal strength. No cranial nerve deficit or sensory deficit. GCS eye subscore is 4. GCS verbal subscore is 5. GCS motor subscore is 6.  Skin: Skin is warm and dry. No abrasion and no rash noted.  Psychiatric: She has a normal mood and affect. Her speech is normal and behavior is normal.  Nursing note and vitals reviewed.    ED Treatments / Results  Labs (all labs ordered are listed, but only abnormal results are displayed) Labs Reviewed  LIPASE, BLOOD  COMPREHENSIVE METABOLIC PANEL  CBC  URINALYSIS, ROUTINE W REFLEX MICROSCOPIC  I-STAT BETA HCG BLOOD, ED ([facility], [facility], [facility] ONLY)    EKG  EKG Interpretation None       Radiology No results found.  Procedures Procedures (including critical care time)  Medications Ordered in ED Medications  ondansetron (ZOFRAN) injection 4 mg (not administered)  ondansetron (ZOFRAN) injection 4 mg (not administered)  morphine 4 MG/ML injection 4 mg (not administered)     Initial Impression / Assessment and Plan / ED Course  I have reviewed the triage vital signs and the nursing notes.  Pertinent labs & imaging results that  were available during my care of the patient were reviewed by me and considered in my medical decision making (see chart for details).     Patient given several rounds of pain medication here now.. CT scan showed a large cystic mass. Patient had ultrasound of pelvis which spinning at this time. Care signed out to Dr. Dalene Seltzer  Final Clinical Impressions(s) / ED Diagnoses   Final diagnoses:  None    New Prescriptions New Prescriptions   No medications on file     Lorre Nick, MD 02/16/17 [phone]

## 2017-02-16 NOTE — Discharge Instructions (Signed)
Pelvic Mass A pelvic mass is an abnormal growth in the pelvis. The pelvis is the area between your hip bones. It includes the bladder and the rectum in males and females, and also the uterus and ovaries in females. What are the causes? Many things can cause a pelvic mass, including:  Cancer.  Fibroids of the uterus.  Ovarian cysts.  Infection.  Ectopic pregnancy.  What are the signs or symptoms? Symptoms of a pelvic mass may include:  Cramping.  Nausea.  Diarrhea.  Fever.  Vomiting.  Weakness.  Pain in the pelvis, side, or back.  Weight loss.  Constipation.  Problems with vaginal bleeding, including: ? Light or heavy bleeding with or without blood clots. ? Irregular menstruation. ? Pain with menstruation.  Problems with urination, including: ? Frequent urination. ? Inability to empty the bladder completely. ? Urinating very small amounts. ? Pain with urination. ? Bloody urine.  Some pelvic masses do not cause symptoms. How is this diagnosed? To make a diagnosis, your health care provider will need to learn more about the mass. You may have tests or procedures done, such as:  Blood tests.  X-rays.  Ultrasound.  CT scan.  MRI.  A surgery to look inside of your abdomen with cameras (laparoscopy).  A biopsy that is performed with a needle or during laparoscopy or surgery.  In some cases, what seemed like a pelvic mass may actually be something else, such as a mass in one of the organs that are near the pelvis, an infection (abscess) or scar tissue (adhesions) that formed after a surgery. How is this treated? Treatment will depend on the cause of the mass. Follow these instructions at home: What you need to do at home will depend on the cause of the mass. Follow the instructions that your health care provider gives to you. In general:  Keep all follow-up visits as directed by your health care provider. This is important.  Take medicines only as  directed by your health care provider.  Follow any restrictions that are given to you by your health care provider.  Contact a health care provider if:  You develop new symptoms. Get help right away if:  You vomit bright red blood or vomit material that looks like coffee grounds.  You have blood in your stools, or the stools turn black and tarry.  You have an abnormal or increased amount of vaginal bleeding.  You have a fever.  You develop easy bruising or bleeding.  You develop sudden or worsening pain that is not controlled by your medicine.  You feel worsening weakness, or you have a fainting episode.  You feel that the mass has suddenly gotten larger.  You develop severe bloating in your abdomen or your pelvis.  You cannot pass any urine.  You are unable to have a bowel movement. This information is not intended to replace advice given to you by your health care provider. Make sure you discuss any questions you have with your health care provider. Document Released: 08/24/2006 Document Revised: 10/23/2015 Document Reviewed: 12/31/2013 Elsevier Interactive Patient Education  2018 [employer]  

## 2017-02-17 ENCOUNTER — Encounter (HOSPITAL_COMMUNITY): Payer: Self-pay | Admitting: Student

## 2017-02-18 ENCOUNTER — Encounter (HOSPITAL_COMMUNITY): Payer: Self-pay | Admitting: Anesthesiology

## 2017-02-18 ENCOUNTER — Other Ambulatory Visit: Payer: Self-pay | Admitting: Obstetrics & Gynecology

## 2017-02-18 ENCOUNTER — Ambulatory Visit (HOSPITAL_COMMUNITY): Payer: PRIVATE HEALTH INSURANCE | Admitting: Anesthesiology

## 2017-02-18 ENCOUNTER — Inpatient Hospital Stay (HOSPITAL_COMMUNITY)
Admission: AD | Admit: 2017-02-18 | Discharge: 2017-02-19 | DRG: 743 | Disposition: A | Discharge status: Home / Self Care | Payer: PRIVATE HEALTH INSURANCE | Source: Ambulatory Visit | Attending: Obstetrics & Gynecology | Admitting: Obstetrics & Gynecology

## 2017-02-18 ENCOUNTER — Encounter (HOSPITAL_COMMUNITY)
Admission: AD | Disposition: A | Discharge status: Home / Self Care | Payer: Self-pay | Source: Ambulatory Visit | Attending: Obstetrics & Gynecology

## 2017-02-18 DIAGNOSIS — N83291 Other ovarian cyst, right side: Secondary | ICD-10-CM | POA: Diagnosis present

## 2017-02-18 DIAGNOSIS — Z9889 Other specified postprocedural states: Secondary | ICD-10-CM

## 2017-02-18 DIAGNOSIS — R102 Pelvic and perineal pain: Secondary | ICD-10-CM | POA: Diagnosis present

## 2017-02-18 DIAGNOSIS — N83511 Torsion of right ovary and ovarian pedicle: Secondary | ICD-10-CM | POA: Diagnosis present

## 2017-02-18 HISTORY — PX: LAPAROTOMY: SHX154

## 2017-02-18 LAB — CA 125: CANCER ANTIGEN (CA) 125: 53.7 U/mL — AB (ref 0.0–38.1)

## 2017-02-18 LAB — TYPE AND SCREEN
ABO/RH(D): B POS
Antibody Screen: NEGATIVE

## 2017-02-18 LAB — ABO/RH: ABO/RH(D): B POS

## 2017-02-18 SURGERY — LAPAROTOMY, EXPLORATORY
Anesthesia: General | Laterality: Right

## 2017-02-18 MED ORDER — KETOROLAC TROMETHAMINE 30 MG/ML IJ SOLN
INTRAMUSCULAR | Status: DC | PRN
  Administered 2017-02-18: 30 mg via INTRAVENOUS

## 2017-02-18 MED ORDER — MENTHOL 3 MG MT LOZG: 1.0000 | LOZENGE | OROMUCOSAL | Status: DC | PRN

## 2017-02-18 MED ORDER — BUPIVACAINE HCL (PF) 0.25 % IJ SOLN
INTRAMUSCULAR | Status: DC | PRN
  Administered 2017-02-18: 17 mL

## 2017-02-18 MED ORDER — ROCURONIUM BROMIDE 100 MG/10ML IV SOLN
INTRAVENOUS | Status: AC
  Filled 2017-02-18: qty 1

## 2017-02-18 MED ORDER — SUGAMMADEX SODIUM 200 MG/2ML IV SOLN
INTRAVENOUS | Status: DC | PRN
  Administered 2017-02-18: 153.4 mg via INTRAVENOUS

## 2017-02-18 MED ORDER — OXYCODONE-ACETAMINOPHEN 5-325 MG PO TABS
1.0000 | ORAL_TABLET | ORAL | Status: DC | PRN
  Administered 2017-02-18 – 2017-02-19 (×4): 2 via ORAL
  Filled 2017-02-18 (×4): qty 2

## 2017-02-18 MED ORDER — PROPOFOL 10 MG/ML IV BOLUS
INTRAVENOUS | Status: DC | PRN
  Administered 2017-02-18: 180 mg via INTRAVENOUS

## 2017-02-18 MED ORDER — ONDANSETRON HCL 4 MG PO TABS: 4.0000 mg | ORAL_TABLET | Freq: Four times a day (QID) | ORAL | Status: DC | PRN

## 2017-02-18 MED ORDER — CEFAZOLIN SODIUM-DEXTROSE 2-4 GM/100ML-% IV SOLN
2.0000 g | INTRAVENOUS | Status: AC
  Administered 2017-02-18: 2 g via INTRAVENOUS

## 2017-02-18 MED ORDER — LIDOCAINE HCL (CARDIAC) 20 MG/ML IV SOLN
INTRAVENOUS | Status: AC
  Filled 2017-02-18: qty 5

## 2017-02-18 MED ORDER — HYDROCODONE-ACETAMINOPHEN 7.5-325 MG PO TABS
ORAL_TABLET | ORAL | Status: AC
  Filled 2017-02-18: qty 1

## 2017-02-18 MED ORDER — ONDANSETRON HCL 4 MG/2ML IJ SOLN: 4.0000 mg | Freq: Four times a day (QID) | INTRAMUSCULAR | Status: DC | PRN

## 2017-02-18 MED ORDER — LIDOCAINE HCL (CARDIAC) 20 MG/ML IV SOLN
INTRAVENOUS | Status: DC | PRN
  Administered 2017-02-18: 100 mg via INTRAVENOUS

## 2017-02-18 MED ORDER — KETOROLAC TROMETHAMINE 30 MG/ML IJ SOLN: 30.0000 mg | Freq: Three times a day (TID) | INTRAMUSCULAR | Status: DC

## 2017-02-18 MED ORDER — SCOPOLAMINE 1 MG/3DAYS TD PT72
1.0000 | MEDICATED_PATCH | TRANSDERMAL | Status: DC
  Administered 2017-02-18: 1.5 mg via TRANSDERMAL

## 2017-02-18 MED ORDER — MIDAZOLAM HCL 2 MG/2ML IJ SOLN
INTRAMUSCULAR | Status: DC | PRN
  Administered 2017-02-18: 2 mg via INTRAVENOUS

## 2017-02-18 MED ORDER — ONDANSETRON HCL 4 MG/2ML IJ SOLN
INTRAMUSCULAR | Status: DC | PRN
  Administered 2017-02-18: 4 mg via INTRAVENOUS

## 2017-02-18 MED ORDER — SCOPOLAMINE 1 MG/3DAYS TD PT72: 1.0000 | MEDICATED_PATCH | Freq: Once | TRANSDERMAL | Status: DC

## 2017-02-18 MED ORDER — BUPIVACAINE HCL (PF) 0.25 % IJ SOLN
INTRAMUSCULAR | Status: AC
  Filled 2017-02-18: qty 30

## 2017-02-18 MED ORDER — LACTATED RINGERS IV SOLN
INTRAVENOUS | Status: DC
  Administered 2017-02-18 (×4): via INTRAVENOUS

## 2017-02-18 MED ORDER — FENTANYL CITRATE (PF) 100 MCG/2ML IJ SOLN
INTRAMUSCULAR | Status: DC | PRN
  Administered 2017-02-18: 50 ug via INTRAVENOUS
  Administered 2017-02-18 (×2): 100 ug via INTRAVENOUS

## 2017-02-18 MED ORDER — SCOPOLAMINE 1 MG/3DAYS TD PT72
MEDICATED_PATCH | TRANSDERMAL | Status: AC
  Administered 2017-02-18: 1.5 mg via TRANSDERMAL
  Filled 2017-02-18: qty 1

## 2017-02-18 MED ORDER — HYDROMORPHONE HCL 1 MG/ML IJ SOLN
0.2500 mg | INTRAMUSCULAR | Status: DC | PRN
  Administered 2017-02-18: 0.5 mg via INTRAVENOUS

## 2017-02-18 MED ORDER — METOCLOPRAMIDE HCL 5 MG/ML IJ SOLN: 10.0000 mg | Freq: Once | INTRAMUSCULAR | Status: DC | PRN

## 2017-02-18 MED ORDER — SUGAMMADEX SODIUM 200 MG/2ML IV SOLN
INTRAVENOUS | Status: AC
  Filled 2017-02-18: qty 2

## 2017-02-18 MED ORDER — GLYCOPYRROLATE 0.2 MG/ML IJ SOLN
INTRAMUSCULAR | Status: AC
  Filled 2017-02-18: qty 3

## 2017-02-18 MED ORDER — ACETAMINOPHEN 500 MG PO TABS: 500.0000 mg | ORAL_TABLET | Freq: Four times a day (QID) | ORAL | Status: DC | PRN

## 2017-02-18 MED ORDER — MIDAZOLAM HCL 2 MG/2ML IJ SOLN
INTRAMUSCULAR | Status: AC
  Filled 2017-02-18: qty 2

## 2017-02-18 MED ORDER — PROPOFOL 10 MG/ML IV BOLUS
INTRAVENOUS | Status: AC
  Filled 2017-02-18: qty 20

## 2017-02-18 MED ORDER — BUPROPION HCL ER (SR) 150 MG PO TB12
150.0000 mg | ORAL_TABLET | Freq: Every day | ORAL | Status: DC
  Administered 2017-02-18 – 2017-02-19 (×2): 150 mg via ORAL
  Filled 2017-02-18 (×2): qty 1

## 2017-02-18 MED ORDER — FENTANYL CITRATE (PF) 100 MCG/2ML IJ SOLN
INTRAMUSCULAR | Status: AC
  Filled 2017-02-18: qty 2

## 2017-02-18 MED ORDER — FENTANYL CITRATE (PF) 250 MCG/5ML IJ SOLN
INTRAMUSCULAR | Status: AC
Start: 2017-02-18 — End: ?
  Filled 2017-02-18: qty 5

## 2017-02-18 MED ORDER — MEPERIDINE HCL 25 MG/ML IJ SOLN: 6.2500 mg | INTRAMUSCULAR | Status: DC | PRN

## 2017-02-18 MED ORDER — HYDROMORPHONE HCL 1 MG/ML IJ SOLN
INTRAMUSCULAR | Status: AC
  Filled 2017-02-18: qty 0.5

## 2017-02-18 MED ORDER — ROCURONIUM BROMIDE 100 MG/10ML IV SOLN
INTRAVENOUS | Status: DC | PRN
  Administered 2017-02-18: 40 mg via INTRAVENOUS

## 2017-02-18 MED ORDER — ONDANSETRON HCL 4 MG/2ML IJ SOLN
INTRAMUSCULAR | Status: AC
  Filled 2017-02-18: qty 2

## 2017-02-18 MED ORDER — DEXAMETHASONE SODIUM PHOSPHATE 10 MG/ML IJ SOLN
INTRAMUSCULAR | Status: DC | PRN
  Administered 2017-02-18: 4 mg via INTRAVENOUS

## 2017-02-18 MED ORDER — LACTATED RINGERS IV SOLN
INTRAVENOUS | Status: DC
  Administered 2017-02-18 – 2017-02-19 (×2): via INTRAVENOUS

## 2017-02-18 MED ORDER — HYDROCODONE-ACETAMINOPHEN 7.5-325 MG PO TABS
1.0000 | ORAL_TABLET | Freq: Once | ORAL | Status: AC | PRN
  Administered 2017-02-18: 1 via ORAL

## 2017-02-18 MED ORDER — DEXAMETHASONE SODIUM PHOSPHATE 4 MG/ML IJ SOLN
INTRAMUSCULAR | Status: AC
  Filled 2017-02-18: qty 1

## 2017-02-18 MED ORDER — PHENYLEPHRINE HCL 10 MG/ML IJ SOLN
INTRAMUSCULAR | Status: DC | PRN
  Administered 2017-02-18: 40 ug via INTRAVENOUS

## 2017-02-18 SURGICAL SUPPLY — 32 items
APL SKNCLS STERI-STRIP NONHPOA (GAUZE/BANDAGES/DRESSINGS) ×1
BENZOIN TINCTURE PRP APPL 2/3 (GAUZE/BANDAGES/DRESSINGS) ×3 IMPLANT
BLADE SURG 10 STRL SS (BLADE) ×6 IMPLANT
CANISTER SUCT 3000ML PPV (MISCELLANEOUS) ×3 IMPLANT
CLOSURE WOUND 1/2 X4 (GAUZE/BANDAGES/DRESSINGS) ×1
CLOTH BEACON ORANGE TIMEOUT ST (SAFETY) ×3 IMPLANT
CONT PATH 16OZ SNAP LID 3702 (MISCELLANEOUS) ×3 IMPLANT
DECANTER SPIKE VIAL GLASS SM (MISCELLANEOUS) IMPLANT
DRAPE WARM FLUID 44X44 (DRAPE) IMPLANT
DRSG OPSITE 6X11 MED (GAUZE/BANDAGES/DRESSINGS) ×2 IMPLANT
DURAPREP 26ML APPLICATOR (WOUND CARE) ×6 IMPLANT
GAUZE SPONGE 4X4 16PLY XRAY LF (GAUZE/BANDAGES/DRESSINGS) IMPLANT
GLOVE BIO SURGEON STRL SZ7 (GLOVE) ×3 IMPLANT
GLOVE BIOGEL PI IND STRL 7.0 (GLOVE) ×3 IMPLANT
GLOVE BIOGEL PI INDICATOR 7.0 (GLOVE) ×6
GOWN STRL REUS W/TWL LRG LVL3 (GOWN DISPOSABLE) ×9 IMPLANT
NEEDLE HYPO 22GX1.5 SAFETY (NEEDLE) IMPLANT
NS IRRIG 1000ML POUR BTL (IV SOLUTION) ×3 IMPLANT
PACK ABDOMINAL GYN (CUSTOM PROCEDURE TRAY) ×3 IMPLANT
PAD OB MATERNITY 4.3X12.25 (PERSONAL CARE ITEMS) ×3 IMPLANT
PROTECTOR NERVE ULNAR (MISCELLANEOUS) ×3 IMPLANT
SPONGE LAP 18X18 X RAY DECT (DISPOSABLE) ×6 IMPLANT
STAPLER VISISTAT 35W (STAPLE) ×3 IMPLANT
STRIP CLOSURE SKIN 1/2X4 (GAUZE/BANDAGES/DRESSINGS) ×2 IMPLANT
SUT PLAIN 2 0 XLH (SUTURE) IMPLANT
SUT PROLENE 0 CT 1 30 (SUTURE) IMPLANT
SUT VIC AB 0 CT1 18XCR BRD8 (SUTURE) IMPLANT
SUT VIC AB 0 CT1 36 (SUTURE) ×6 IMPLANT
SUT VIC AB 0 CT1 8-18 (SUTURE)
SYR CONTROL 10ML LL (SYRINGE) IMPLANT
TOWEL OR 17X24 6PK STRL BLUE (TOWEL DISPOSABLE) ×6 IMPLANT
TRAY FOLEY CATH SILVER 14FR (SET/KITS/TRAYS/PACK) ×3 IMPLANT

## 2017-02-18 NOTE — Anesthesia Procedure Notes (Signed)
Procedure Name: Intubation Date/Time: 02/18/2017 12:22 PM Performed by: Hewitt Blade Pre-anesthesia Checklist: Patient identified, Emergency Drugs available, Suction available and Patient being monitored Patient Re-evaluated:Patient Re-evaluated prior to induction Oxygen Delivery Method: Circle system utilized Preoxygenation: Pre-oxygenation with 100% oxygen Induction Type: IV induction Ventilation: Mask ventilation without difficulty Laryngoscope Size: Mac and 4 Grade View: Grade I Tube type: Oral Tube size: 7.0 mm Number of attempts: 1 Airway Equipment and Method: Stylet Placement Confirmation: ETT inserted through vocal cords under direct vision,  positive ETCO2 and breath sounds checked- equal and bilateral Secured at: 20 cm Tube secured with: Tape Dental Injury: Teeth and Oropharynx as per pre-operative assessment

## 2017-02-18 NOTE — Anesthesia Preprocedure Evaluation (Signed)
Anesthesia Evaluation  Patient identified by MRN, date of birth, ID band Patient awake    Reviewed: Allergy & Precautions, NPO status , Patient's Chart, lab work & pertinent test results  Airway Mallampati: II  TM Distance: >3 FB Neck ROM: Full    Dental no notable dental hx. (+) Teeth Intact   Pulmonary neg pulmonary ROS,    Pulmonary exam normal breath sounds clear to auscultation       Cardiovascular negative cardio ROS Normal cardiovascular exam Rhythm:Regular Rate:Normal     Neuro/Psych negative neurological ROS  negative psych ROS   GI/Hepatic negative GI ROS, Neg liver ROS,   Endo/Other  negative endocrine ROS  Renal/GU negative Renal ROS  negative genitourinary   Musculoskeletal negative musculoskeletal ROS (+)   Abdominal   Peds  Hematology negative hematology ROS (+)   Anesthesia Other Findings   Reproductive/Obstetrics Right ovarian torsion                             Anesthesia Physical Anesthesia Plan  ASA: I and emergent  Anesthesia Plan: General   Post-op Pain Management:    Induction:   PONV Risk Score and Plan: 4 or greater and Ondansetron, Dexamethasone, Midazolam, Scopolamine patch - Pre-op and Propofol infusion  Airway Management Planned: Oral ETT  Additional Equipment:   Intra-op Plan:   Post-operative Plan: Extubation in OR  Informed Consent: I have reviewed the patients History and Physical, chart, labs and discussed the procedure including the risks, benefits and alternatives for the proposed anesthesia with the patient or authorized representative who has indicated his/her understanding and acceptance.   Dental advisory given  Plan Discussed with: CRNA, Anesthesiologist and Surgeon  Anesthesia Plan Comments:         Anesthesia Quick Evaluation

## 2017-02-18 NOTE — H&P (Signed)
Heidi Hardin is a 31 y.o. Female with acute pelvic pain since 9/18, was seen at [facility] ER on 9/19 noted to have 11-12 cm complex/ solid right adnexal mass with concern for ovarian cystadenoma/ cancer. Pain come and goes, Doppler not done/ not found.   Ca125 just came back at 53.   She is healthy, famHx of cancers, she is BRCA negative. No prior Gyn problems or surgery.   Past Medical History:  Diagnosis Date  . Anxiety     Past Surgical History:  Procedure Laterality Date  . MOUTH SURGERY  2000    Family History  Problem Relation Age of Onset  . Cancer Father     Social History  Substance Use Topics  . Smoking status: Never Smoker  . Smokeless tobacco: Never Used  . Alcohol use No    Allergies: No Known Allergies  Prescriptions Prior to Admission  Medication Sig Dispense Refill Last Dose  . acetaminophen (TYLENOL) 500 MG tablet Take 2 tablets (1,000 mg total) by mouth every 6 (six) hours as needed. 30 tablet 0 02/18/2017 at 0730  . ALPRAZolam (XANAX) 0.25 MG tablet Take 0.25 mg by mouth at bedtime as needed for anxiety.   Past Week at Unknown time  . buPROPion (WELLBUTRIN SR) 150 MG 12 hr tablet Take 150 mg by mouth daily.  5 Past Week at Unknown time  . naproxen (NAPROSYN) 500 MG tablet Take 1 tablet (500 mg total) by mouth 2 (two) times daily. 30 tablet 0 02/18/2017 at 0730  . oxyCODONE (ROXICODONE) 5 MG immediate release tablet Take 1 tablet (5 mg total) by mouth every 4 (four) hours as needed for severe pain. 25 tablet 0 02/18/2017 at 0730    Review of Systems  Constitutional: Negative.   Gastrointestinal: Positive for abdominal pain. Negative for constipation, diarrhea, nausea and vomiting.  Genitourinary: Positive for vaginal bleeding. Negative for dysuria and vaginal discharge.   Physical Exam   BP (!) 144/97   Pulse 70   Temp 98.2 F (36.8 C) (Oral)   Resp 20   LMP 02/14/2017 Comment: neg preg test  SpO2 99%    Physical Exam  Nursing note and vitals  reviewed. Constitutional: She is oriented to person, place, and time. She appears well-developed and well-nourished. No distress.  HENT:  Head: Normocephalic and atraumatic.  Eyes: Conjunctivae are normal. Right eye exhibits no discharge. Left eye exhibits no discharge. No scleral icterus.  Neck: Normal range of motion.  Respiratory: Effort normal. No respiratory distress.  GI: Soft. Bowel sounds are normal. She exhibits mass (mid lower abdomen). There is tenderness in the suprapubic area. There is no rigidity, no rebound and no guarding.  Neurological: She is alert and oriented to person, place, and time.  Skin: Skin is warm and dry. She is not diaphoretic.  Psychiatric: She has a normal mood and affect. Her behavior is normal. Judgment and thought content normal.     Assessment and Plan  A/P: Large right ovarian mass, possible benign vs borderline vs cancer. At this point significant pain concerns for ovarian torsion. I reviewed images with Dr Dover/ on call radiologist today and Dr Denman George, Ria Comment and d/w her Ca125 and her clinical presentation Considering pain, proceed with surgery Recommend Laparotomy and intact specimen removal with ovary Pelvic washings  Plan - Laparotomy, right oophorectomy, possible right salpingectomy, pelvic washings. Risks/complications of surgery reviewed incl infection, bleeding, damage to internal organs including bladder, bowels, ureters, blood vessels, other risks from anesthesia, VTE and delayed complications  of any surgery, complications in future surgery reviewed.    Vale Mousseau R 02/18/2017, 12:05 PM

## 2017-02-18 NOTE — Anesthesia Postprocedure Evaluation (Signed)
Anesthesia Post Note  Patient: Angelle Isais  Procedure(s) Performed: Procedure(s) (LRB): EXPLORATORY LAPAROTOMY RIGHT SALPINGOOOPHORECTOMY AND PELVIC WASHINGS (Right)     Patient location during evaluation: PACU Anesthesia Type: General Level of consciousness: awake and alert Pain management: pain level controlled Vital Signs Assessment: post-procedure vital signs reviewed and stable Respiratory status: spontaneous breathing, nonlabored ventilation, respiratory function stable and patient connected to nasal cannula oxygen Cardiovascular status: blood pressure returned to baseline and stable Postop Assessment: no apparent nausea or vomiting Anesthetic complications: no    Last Vitals:  Vitals:   02/18/17 1430 02/18/17 1453  BP: 119/77 120/67  Pulse: 73 66  Resp: 18 16  Temp: (!) 36.3 C 36.9 C  SpO2: 95% 94%    Last Pain:  Vitals:   02/18/17 1504  TempSrc:   PainSc: 0-No pain   Pain Goal: Patients Stated Pain Goal: 3 (02/18/17 1145)               Dariush Mcnellis A.

## 2017-02-18 NOTE — Transfer of Care (Signed)
Immediate Anesthesia Transfer of Care Note  Patient: Cybele Maule  Procedure(s) Performed: Procedure(s) with comments: EXPLORATORY LAPAROTOMY RIGHT SALPINGOOOPHORECTOMY AND PELVIC WASHINGS (Right) - pelvic washings and removal right S&O   Patient Location: PACU  Anesthesia Type:General  Level of Consciousness: awake, alert  and oriented  Airway & Oxygen Therapy: Patient Spontanous Breathing and Patient connected to nasal cannula oxygen  Post-op Assessment: Report given to RN, Post -op Vital signs reviewed and stable and Patient moving all extremities  Post vital signs: Reviewed and stable  Last Vitals:  Vitals:   02/18/17 1145  BP: (!) 144/97  Pulse: 70  Resp: 20  Temp: 36.8 C  SpO2: 99%    Last Pain:  Vitals:   02/18/17 1145  TempSrc: Oral      Patients Stated Pain Goal: 3 (02/18/17 1145)  Complications: No apparent anesthesia complications

## 2017-02-18 NOTE — Op Note (Signed)
PRE-OPERATIVE DIAGNOSIS:  Large right ovarian complex cyst, acute abdominal pain, suspect torsion  POST-OPERATIVE DIAGNOSIS:  Same, Right ovarian complex cyst with tubo-ovarian torsion   PROCEDURE:  Laparotomy, right salpingo-ophorectomy, pelvic washings   SURGEON: Robley Fries, MD  ASSISTANT:  Maxie Better, MD   ANESTHESIA:  General endotracheal  EBL: 55 cc  IVF: 3000 cc LR  Urine output: urine in foley  50 cc clear   BLOOD ADMINISTERED:  None   LOCAL MEDICATIONS USED:  MARCAINE 0.25% 10 cc skin infiltration     SPECIMEN:   Right ovary with cyst and right fallopian tube  DISPOSITION OF SPECIMEN:  PATHOLOGY  COUNTS:  YES  PATIENT DISPOSITION:  PACU - hemodynamically stable. Then admit for post-op care.   Delay start of Pharmacological VTE agent (>24hrs) due to surgical blood loss or risk of bleeding: yes  PROCEDURE:   Indication:  Symptomatic  Risks and complications of surgery including infection, bleeding, damage to internal organs and other including but not limited to surgery related problems including pneumonia, VTE reviewed. Informed written consent was obtained.  Patient was brought to the operating room with IV running. She received 2 gm Ancef. Underwent general anesthesia without difficulty, dorsal supine position, prepped and draped in sterile fashion. Foley catheter placed. Exam under anesthesia noted large infraumbilical soft mass. Pfannenstiel incision was made with scalpel and carried down to the underlying fascia with Bovie with excellent hemostasis. Fascia incised and extended laterally, underlying rectus muscles dissected and separated in midline. Posterior rectus sheath and posterior peritoneum was grasped with mosquitoes and peritoneal entry made. Small peritoneal opening used and pelvic washing done, irrigated fluid sent to pathology. Now the peritoneum was extended longitudinally. Exam noted large right ovarian cyst with smooth surface, dark purple  color consistent with torsion. Uterus, left tube and ovary normal, no pelvic nodularity, no omental mass, no palpable lymph nodes noted on exam, liver surface felt smooth.   Right ovary with the cyst was delivered out of the incision intact. Torsion was undone and round ligament identified. Fallopian tube was also involved in the ovarian torsion and decision was made to proceed with right salpingo-ophorectomy. Two Heaney clamps placed on infundibulopelvic ligament along with the tube in it and pedicle cut, specimen handed off for pathology. Hemostasis noted. Bleeding on board ligament edge cauterized. Right ureter was seen peristalsing well.  Abdominal closure done with  Peritoneal closure with 2-0 Vicryl. Fascia sutured with 0 Vicryl . Subcutaneous layer with 2-0 Plain gut. Skin approximated with 4-0 Vicryl in subcuticular fashion.  Steristrips, Honeycomb and pressure dressing placed. All  Instruments/ lap/ sponges counts were correct x2. No complications.  I performed the surgery. _V.Juliene Pina, MD

## 2017-02-19 ENCOUNTER — Encounter (HOSPITAL_COMMUNITY): Payer: Self-pay | Admitting: Obstetrics & Gynecology

## 2017-02-19 LAB — CBC
HCT: 27.3 % — ABNORMAL LOW (ref 36.0–46.0)
HEMOGLOBIN: 9.2 g/dL — AB (ref 12.0–15.0)
MCH: 28.7 pg (ref 26.0–34.0)
MCHC: 33.7 g/dL (ref 30.0–36.0)
MCV: 85 fL (ref 78.0–100.0)
PLATELETS: 155 10*3/uL (ref 150–400)
RBC: 3.21 MIL/uL — AB (ref 3.87–5.11)
RDW: 13.4 % (ref 11.5–15.5)
WBC: 9.1 10*3/uL (ref 4.0–10.5)

## 2017-02-19 MED ORDER — LIDOCAINE HCL (PF) 1 % IJ SOLN
INTRAMUSCULAR | Status: AC
  Filled 2017-02-19: qty 5

## 2017-02-19 MED ORDER — FERROUS SULFATE 325 (65 FE) MG PO TABS: 325.0000 mg | ORAL_TABLET | Freq: Every day | ORAL | 3 refills | Status: DC

## 2017-02-19 MED ORDER — OXYCODONE-ACETAMINOPHEN 5-325 MG PO TABS: 1.0000 | ORAL_TABLET | ORAL | 0 refills | Status: DC | PRN

## 2017-02-19 MED ORDER — NAPROXEN 500 MG PO TABS: 500.0000 mg | ORAL_TABLET | Freq: Two times a day (BID) | ORAL | 0 refills | Status: DC

## 2017-02-19 NOTE — Progress Notes (Signed)
Discharge teaching complete. Pt understood all information and did not have any questions. Dressing change done with MD order. Pt discharged home with family.

## 2017-02-19 NOTE — Progress Notes (Signed)
Patient ID: Heidi Hardin, female   DOB: [birth year], 31 y.o.   MRN: [id number] Subjective: Feels well, ate regular diet, ambulating well. Pain well controlled. No n/v. Voiding well.   Objective: BP (!) 98/56 (BP Location: Right Arm)   Pulse (!) 53   Temp 97.9 F (36.6 C) (Oral)   Resp 18   Ht  (1.6 m)   Wt 165 lb (74.8 kg)   LMP 02/14/2017 Comment: neg preg test  SpO2 97%   BMI 29.23 kg/m   Intake/Output from previous day: 09/21 0701 - 09/22 0700 In: 7240 [P.O.:840; I.V.:4500] Out: 300 [Urine:250; Blood:50]  Lab Results:  Recent Labs  02/16/17 1233 02/19/17 0914  WBC 16.2* 9.1  HGB 14.0 9.2*  HCT 39.8 27.3*  PLT 241 155    Assessment/Plan: POD #1, Laparotomy, RSO, washings for right ovarian mass with torsion.  Doing well. Change honeycomb dressing. Discharge home Surgical findings and post-op care, warning s/s reviewed  F/up Dr Juliene Pina 2 wks   Heidi Hardin R 02/19/2017, 10:07 AM

## 2017-02-19 NOTE — Discharge Summary (Signed)
Physician Discharge Summary  Patient ID: Heidi Hardin MRN: [id number] DOB/AGE: 1985/12/28 31 y.o.  Admit date: 02/18/2017 Discharge date: 02/19/2017  Admission Diagnoses: Acute pelvic pain. Right ovarian complex/solid mass.   Discharge Diagnoses: Right ovarian cyst with right tubo-ovarian torsion S/p Laparotomy and Right salpingo-ophorectomy   Discharged Condition: good  Hospital Course: Uncomplicated surgery and post-op recovery. Ambulating, voiding since foley removed, tolerating regular diet and pain well controlled.   Discharge Exam: Blood pressure (!) 98/56, pulse (!) 53, temperature 97.9 F (36.6 C), temperature source Oral, resp. rate 18, height  (1.6 m), weight 165 lb (74.8 kg), last menstrual period 02/14/2017, SpO2 97 %. General appearance: alert and cooperative Resp: normal air sounds bilaterally Cardio: regular rate and rhythm, S1, S2 normal, no murmur, click, rub or gallop GI: soft, non-tender; bowel sounds normal; no masses,  no organomegaly Extremities: extremities normal, atraumatic, no cyanosis or edema Neurologic: Grossly normal Incision/Wound: clean and dry/ intact   Disposition: 01-Home or Self Care  Discharge Instructions    Call MD for:  difficulty breathing, headache or visual disturbances    Complete by:  As directed    Call MD for:  extreme fatigue    Complete by:  As directed    Call MD for:  hives    Complete by:  As directed    Call MD for:  persistant dizziness or light-headedness    Complete by:  As directed    Call MD for:  persistant nausea and vomiting    Complete by:  As directed    Call MD for:  redness, tenderness, or signs of infection (pain, swelling, redness, odor or green/yellow discharge around incision site)    Complete by:  As directed    Call MD for:  severe uncontrolled pain    Complete by:  As directed    Call MD for:  temperature >100.4    Complete by:  As directed    Diet - low sodium heart healthy    Complete by:  As  directed    Discharge wound care:    Complete by:  As directed    Remove honeycomb dressing on 02/25/17   Driving Restrictions    Complete by:  As directed    2 weeks   Increase activity slowly    Complete by:  As directed    Lifting restrictions    Complete by:  As directed    20 lbs for 6 weeks   Sexual Activity Restrictions    Complete by:  As directed    2 weeks     Allergies as of 02/19/2017   No Known Allergies     Medication List    STOP taking these medications   oxyCODONE 5 MG immediate release tablet Commonly known as:  ROXICODONE     TAKE these medications   acetaminophen 500 MG tablet Commonly known as:  TYLENOL Take 2 tablets (1,000 mg total) by mouth every 6 (six) hours as needed. What changed:  reasons to take this   ALPRAZolam 0.25 MG tablet Commonly known as:  XANAX Take 0.25 mg by mouth at bedtime as needed for anxiety.   buPROPion 150 MG 12 hr tablet Commonly known as:  WELLBUTRIN SR Take 150 mg by mouth daily.   ferrous sulfate 325 (65 FE) MG tablet Commonly known as:  FERROUSUL Take 1 tablet (325 mg total) by mouth daily with breakfast.   naproxen 500 MG tablet Commonly known as:  NAPROSYN Take 1 tablet (500 mg  total) by mouth 2 (two) times daily.   oxyCODONE-acetaminophen 5-325 MG tablet Commonly known as:  PERCOCET/ROXICET Take 1-2 tablets by mouth every 4 (four) hours as needed for severe pain (moderate to severe pain (when tolerating fluids)).            Discharge Care Instructions        Start     Ordered   02/19/17 0000  naproxen (NAPROSYN) 500 MG tablet  2 times daily     02/19/17 1151   02/19/17 0000  oxyCODONE-acetaminophen (PERCOCET/ROXICET) 5-325 MG tablet  Every 4 hours PRN     02/19/17 1151   02/19/17 0000  Increase activity slowly     02/19/17 1151   02/19/17 0000  Diet - low sodium heart healthy     02/19/17 1151   02/19/17 0000  Call MD for:  temperature >100.4     02/19/17 1151   02/19/17 0000  Call MD for:   persistant nausea and vomiting     02/19/17 1151   02/19/17 0000  Call MD for:  severe uncontrolled pain     02/19/17 1151   02/19/17 0000  Call MD for:  redness, tenderness, or signs of infection (pain, swelling, redness, odor or green/yellow discharge around incision site)     02/19/17 1151   02/19/17 0000  Call MD for:  difficulty breathing, headache or visual disturbances     02/19/17 1151   02/19/17 0000  Call MD for:  hives     02/19/17 1151   02/19/17 0000  Call MD for:  persistant dizziness or light-headedness     02/19/17 1151   02/19/17 0000  Call MD for:  extreme fatigue     02/19/17 1151   02/19/17 0000  Driving Restrictions    Comments:  2 weeks   02/19/17 1151   02/19/17 0000  Lifting restrictions    Comments:  20 lbs for 6 weeks   02/19/17 1151   02/19/17 0000  Sexual Activity Restrictions    Comments:  2 weeks   02/19/17 1151   02/19/17 0000  Discharge wound care:    Comments:  Remove honeycomb dressing on 02/25/17   02/19/17 1151   02/19/17 0000  ferrous sulfate (FERROUSUL) 325 (65 FE) MG tablet  Daily with breakfast     02/19/17 1151     Follow-up Information    Shea Evans, MD Follow up in 2 week(s).   Specialty:  Obstetrics and Gynecology Contact information: [address] [place] [address] [phone]           Signed: Robley Fries 02/19/2017, 11:51 AM

## 2017-02-19 NOTE — Discharge Instructions (Signed)
Exploratory Laparotomy, Adult, Care After °Refer to this sheet in the next few weeks. These instructions provide you with information about caring for yourself after your procedure. Your health care provider may also give you more specific instructions. Your treatment has been planned according to current medical practices, but problems sometimes occur. Call your health care provider if you have any problems or questions after your procedure. °What can I expect after the procedure? °After your procedure, it is typical to have: °· Abdominal soreness. °· Fatigue. °· A sore throat from tubes in your throat. °· A lack of appetite. ° °Follow these instructions at home: °Medicines °· Take medicines only as directed by your health care provider. °· Do not drive or operate heavy machinery while taking pain medicine. °Incision care °· There are many different ways to close and cover an incision, including stitches (sutures), skin glue, and adhesive strips. Follow your health care provider's instructions about: °? Incision care. °? Bandage (dressing) changes and removal. °? Incision closure removal. °· Do not take showers or baths until your health care provider says that you can. °· Check your incision area daily for signs of infection. Watch for: °? Redness. °? Tenderness. °? Swelling. °? Drainage. °Activity °· Do not lift anything that is heavier than 10 pounds (4.5 kg) until your health care provider says that it is safe. °· Try to walk a little bit each day if your health care provider says that it is okay. °· Ask your health care provider when you can start to do your usual activities again, such as driving, going back to work, and having sex. °Eating and drinking °· You may eat what you usually eat. Include lots of whole grains, fruits, and vegetables in your diet. This will help to prevent constipation. °· Drink enough fluid to keep your urine clear or pale yellow. °General instructions °· Keep all follow-up visits as  directed by your health care provider. This is important. °Contact a health care provider if: °· You have a fever. °· You have chills. °· Your pain medicine is not helping. °· You have constipation or diarrhea. °· You have nausea or vomiting. °· You have drainage, redness, swelling, or pain at your incision site. °Get help right away if: °· Your pain is getting worse. °· It has been more than 3 days since you been able to have a bowel movement. °· You have ongoing (persistent) vomiting. °· The edges of your incision open up. °· You have warmth, tenderness, and swelling in your calf. °· You have trouble breathing. °· You have chest pain. °This information is not intended to replace advice given to you by your health care provider. Make sure you discuss any questions you have with your health care provider. °Document Released: 12/30/2003 Document Revised: 10/23/2015 Document Reviewed: 01/02/2014 °Elsevier Interactive Patient Education © 2018 [employer] ° °

## 2017-02-25 ENCOUNTER — Telehealth: Payer: Self-pay

## 2017-02-25 NOTE — Telephone Encounter (Signed)
Patient contacted regarding referral for oncology appointment. She has already been seen and had surgery.Heidi Hardin

## 2017-05-31 NOTE — L&D Delivery Note (Signed)
Delivery Note  First Stage: Induction: Cytotec x 2  Labor onset: 04/05/18 Augmentation : Foley bulb  Analgesia Eliezer Lofts intrapartum: IV Fentanyl then epidural  SROM at 12:10pm - clear fluid   Second Stage: Complete dilation at 1350 Onset of pushing at 1350 FHR second stage: category 2; variable decelerations to nadir of 60-90 bpm with good return to baseline and moderate variability; Dr. Ernestina Penna asked to standby in room in case of vacuum.  But patient pushed well over an intact perineum.   Delivery of a healthy, viable female "Banks" at 30 by Carlean Jews, CNM in OA position to ROA then restitution to ROT Nuchal cord x 1, not reducible, delivered body through the cord  Cord double clamped after cessation of pulsation, cut by patient's sister  Cord blood sample collected  Terminal meconium noted   Third Stage: Placenta delivered via Tomasa Blase intact with 3 VC @ 1454 Placenta disposition: hospital disposal  Uterine tone firm / bleeding brisk, but slowed with Pitocin IV bolus  2nd degree perineal laceration identified, after repair completed, 2 separate areas in the vaginal mucosa were bleeding - 2 figure of 8 stitches placed with good hemostasis  Anesthesia for repair: epidural Repair: 3.0 vicryl in standard fashion  Est. Blood Loss (mL):  Complications: none  Mom to postpartum.  Baby to Couplet care / Skin to Skin.  Newborn: Birth Weight: 7#2.6oz Apgar Scores: 9, 9 Feeding planned: breast  Carlean Jews, MSN, CNM [facility]

## 2017-09-08 LAB — OB RESULTS CONSOLE HIV ANTIBODY (ROUTINE TESTING): HIV: NONREACTIVE

## 2017-09-08 LAB — OB RESULTS CONSOLE ABO/RH: RH Type: POSITIVE

## 2017-09-08 LAB — OB RESULTS CONSOLE HEPATITIS B SURFACE ANTIGEN: Hepatitis B Surface Ag: NEGATIVE

## 2017-09-08 LAB — OB RESULTS CONSOLE GC/CHLAMYDIA
CHLAMYDIA, DNA PROBE: NEGATIVE
GC PROBE AMP, GENITAL: NEGATIVE

## 2017-09-08 LAB — OB RESULTS CONSOLE RUBELLA ANTIBODY, IGM: Rubella: IMMUNE

## 2017-09-08 LAB — OB RESULTS CONSOLE ANTIBODY SCREEN: ANTIBODY SCREEN: NEGATIVE

## 2017-09-08 LAB — OB RESULTS CONSOLE RPR: RPR: NONREACTIVE

## 2017-12-30 IMAGING — CT CT ABD-PELV W/ CM
2 of 4 series · 16 of 46 positions shown, 18 images · IV contrast (iopamidol)
Comparison: None.

CLINICAL DATA: Abdominal distention, right lower quadrant pain

EXAM:
CT ABDOMEN AND PELVIS WITH CONTRAST
TECHNIQUE: Multidetector CT imaging of the abdomen and pelvis was performed
using the standard protocol following bolus administration of
intravenous contrast.
CONTRAST:  100mL [id number] IOPAMIDOL ([id number]) INJECTION 61%

[Series 2: abd/pel with · axial · 0.80mm/px · z∈[+1099,+1519]mm · 13 of 96 slices shown, 15 images]
[im 6/96  soft-tissue]
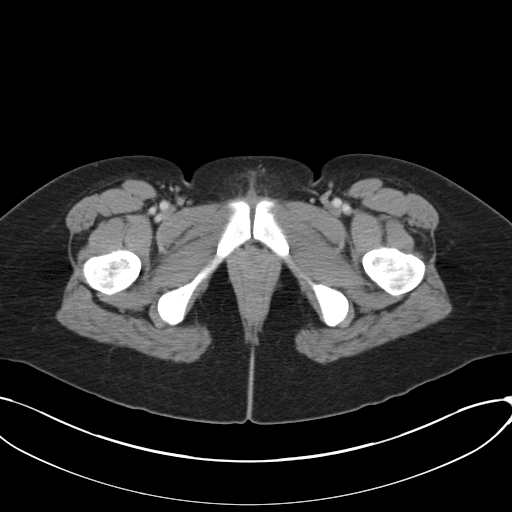
[im 6/96  bone]
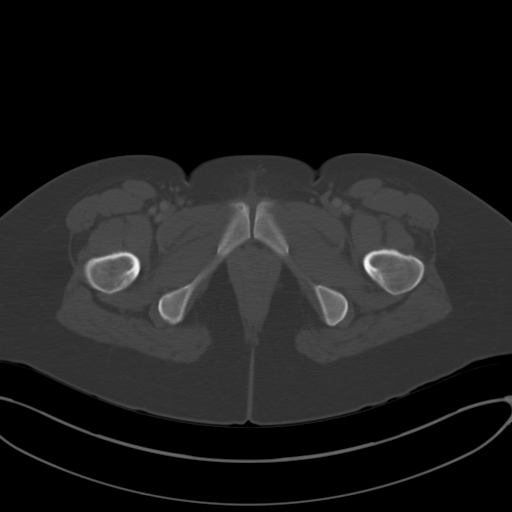
[im 12/96  soft-tissue]
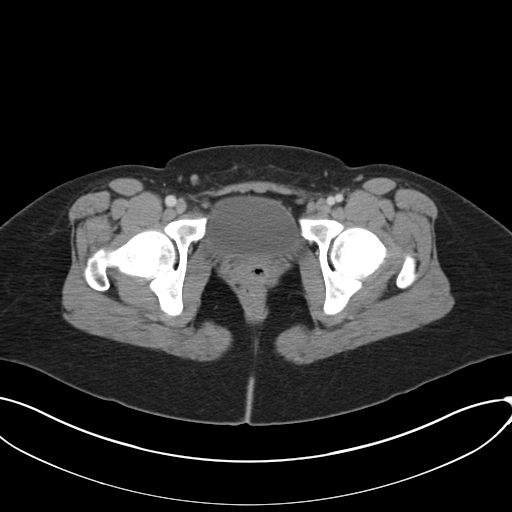
[im 23/96  soft-tissue]
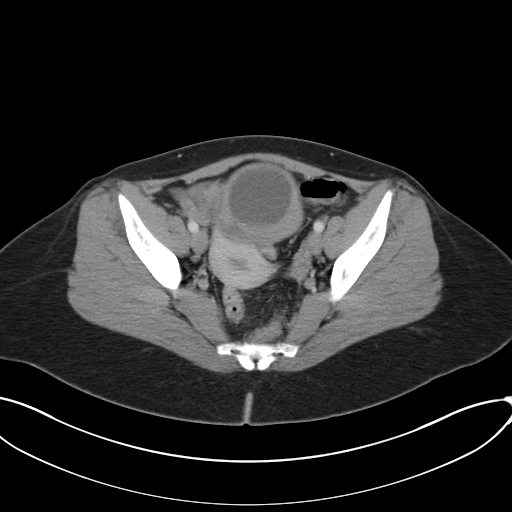
[im 28/96  soft-tissue]
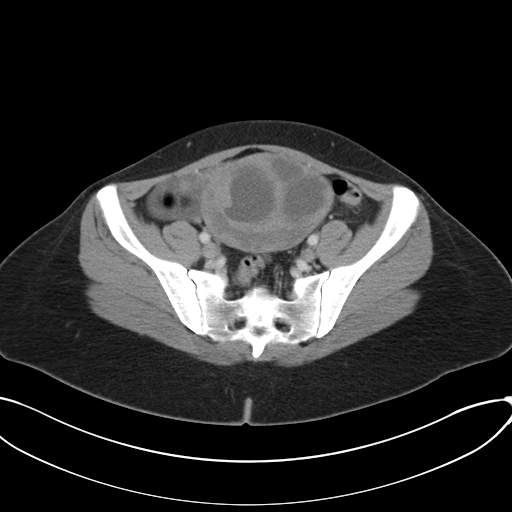
[im 34/96  soft-tissue]
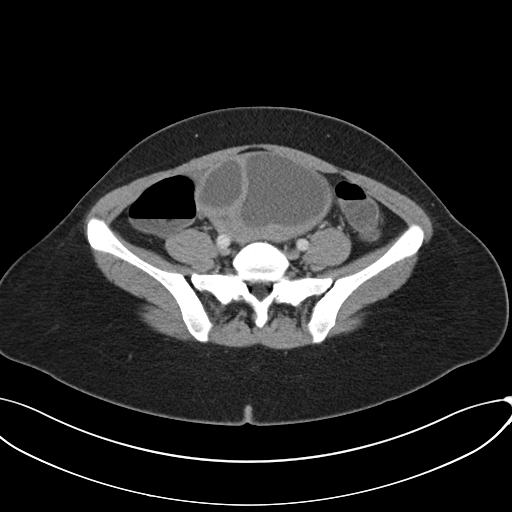
[im 40/96  soft-tissue]
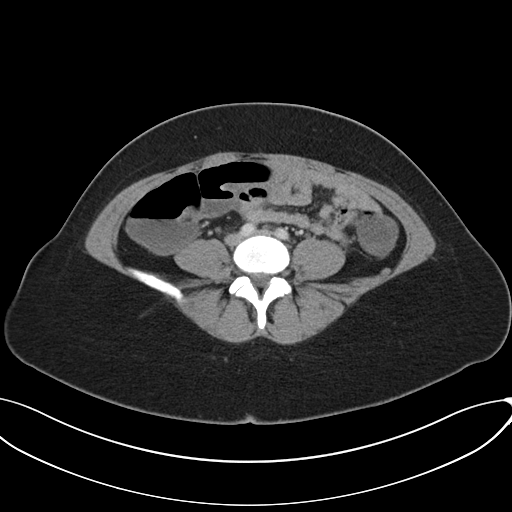
[im 51/96  soft-tissue]
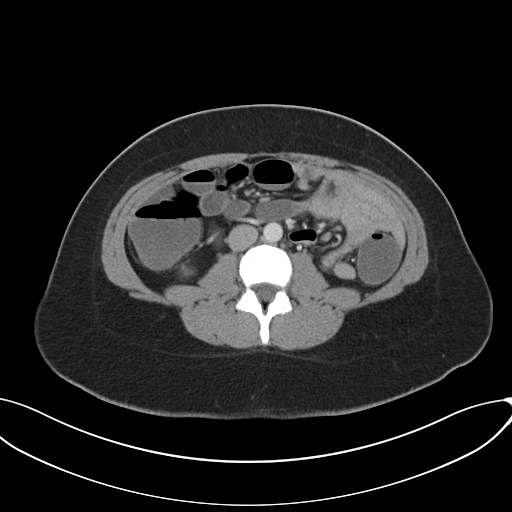
[im 56/96  soft-tissue]
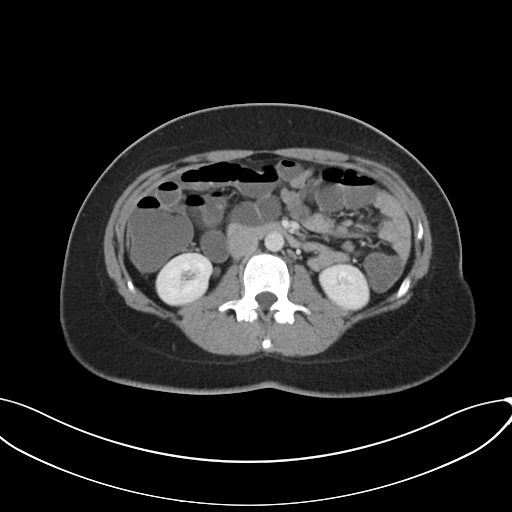
[im 62/96  soft-tissue]
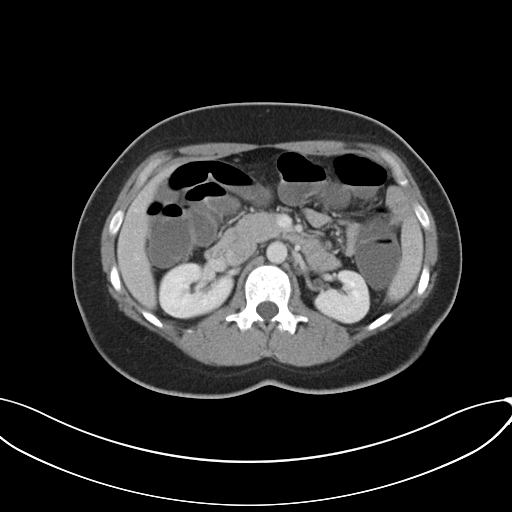
[im 62/96  bone]
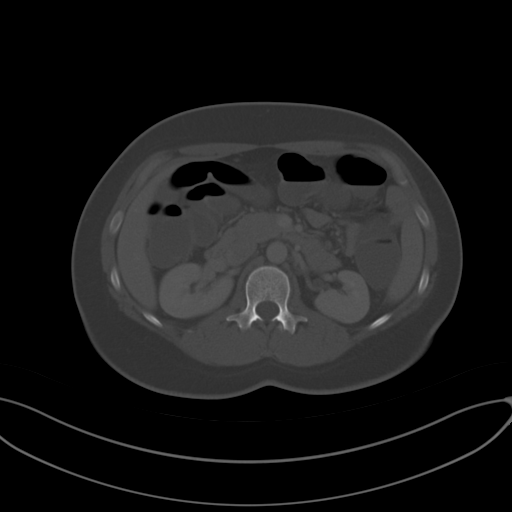
[im 68/96  soft-tissue]
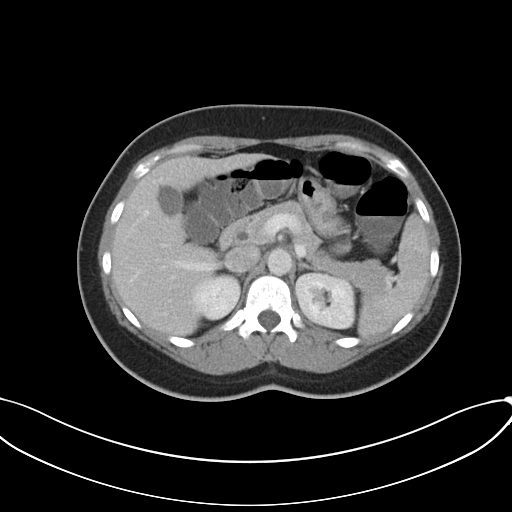
[im 73/96  soft-tissue]
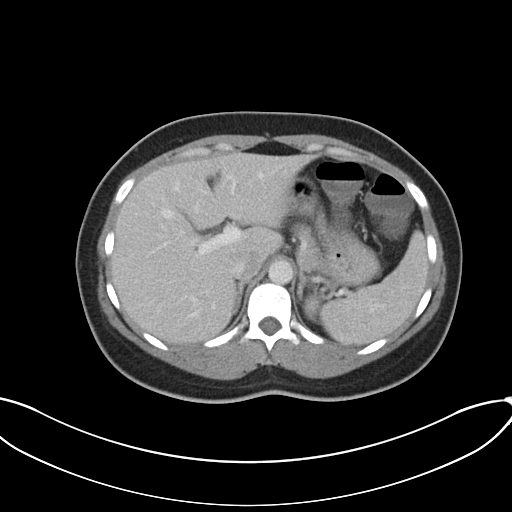
[im 84/96  soft-tissue]
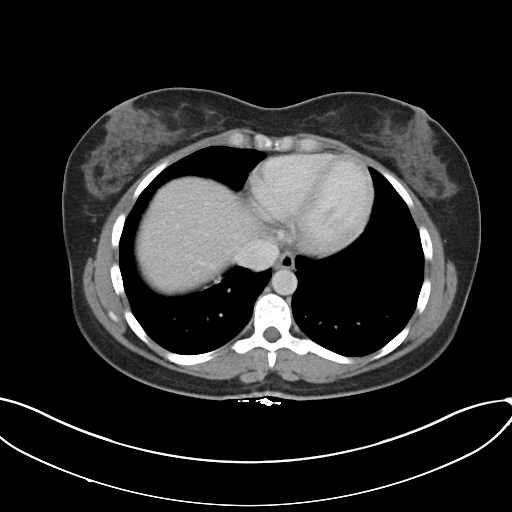
[im 90/96  soft-tissue]
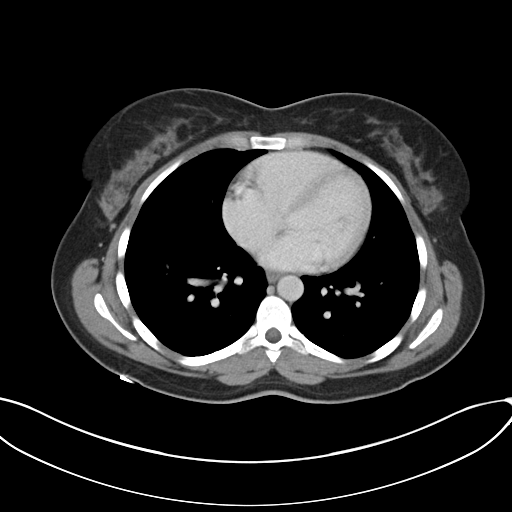

[Series 3: coronal a/|p · coronal · 0.74mm/px · 3 of 137 slices shown]
[im 46/137  soft-tissue]
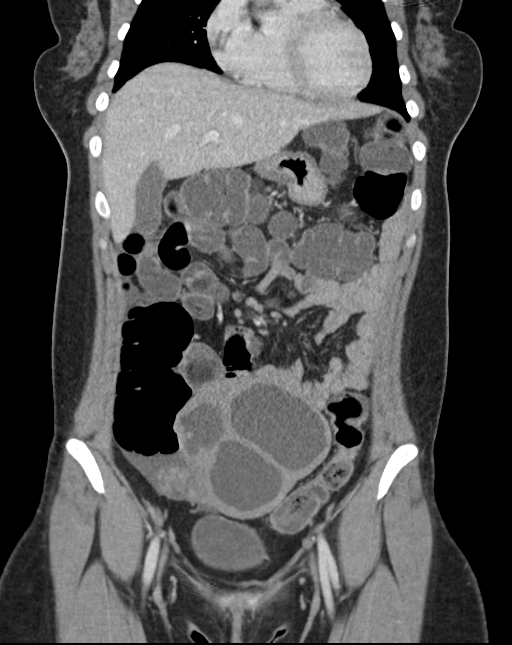
[im 61/137  soft-tissue]
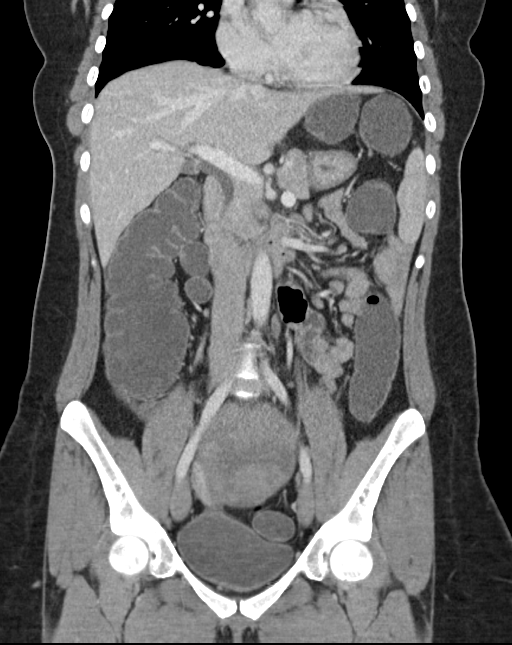
[im 76/137  soft-tissue]
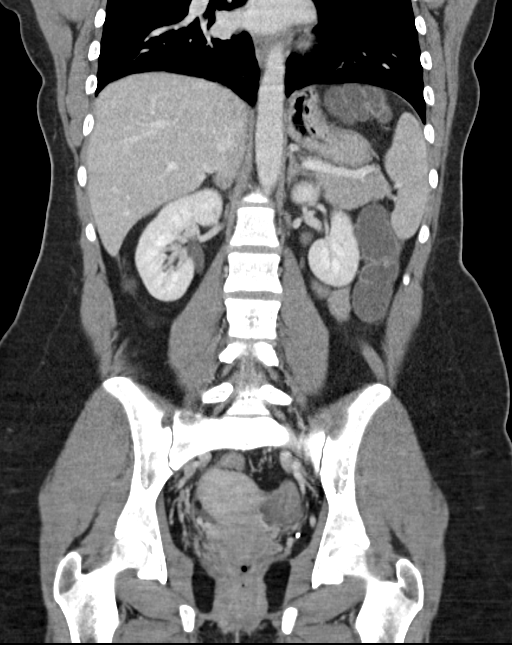

[16 of 46 positions shown; findings below may reference images not displayed]

FINDINGS: Lower chest: Lung bases are clear. No effusions. Heart is normal
size.

Hepatobiliary: No focal hepatic abnormality. Gallbladder
unremarkable.

Pancreas: No focal abnormality or ductal dilatation.

Spleen: No focal abnormality.  Normal size.

Adrenals/Urinary Tract: No adrenal abnormality. No focal renal
abnormality. No stones or hydronephrosis. Urinary bladder is
unremarkable.

Stomach/Bowel: Colon is fluid-filled with air-fluid levels suggests
the possibility of diarrhea. Normal appendix. Stomach and small
bowel decompressed, unremarkable.

Vascular/Lymphatic: No evidence of aneurysm or adenopathy.

Reproductive: Uterus and left ovary unremarkable. There is a large
complex mixed solid and cystic mass in the midline of the pelvis
measuring 11.2 x 10.3 x 8.2 cm. Favor this represents complex mass
within the right ovary.

Other: Small amount of free fluid in the pelvis.  No free air.

Musculoskeletal: No acute bony abnormality.
IMPRESSION: Large complex cystic and solid mass in the midline of the pelvis
measuring 11.2 x 10.3 x 8.2 cm. I favor this represents the right
ovary. Cannot exclude cystic ovarian neoplasm, benign or malignant.

Small amount of free fluid in the pelvis.

Appendix is normal.

Fluid filled prominent colon with air-fluid levels suggesting
diarrhea.

## 2018-03-07 LAB — OB RESULTS CONSOLE GBS: STREP GROUP B AG: NEGATIVE

## 2018-03-31 ENCOUNTER — Telehealth (HOSPITAL_COMMUNITY): Payer: Self-pay | Admitting: *Deleted

## 2018-03-31 ENCOUNTER — Encounter (HOSPITAL_COMMUNITY): Payer: Self-pay | Admitting: *Deleted

## 2018-03-31 NOTE — Telephone Encounter (Signed)
Preadmission screen  

## 2018-04-04 ENCOUNTER — Other Ambulatory Visit: Payer: Self-pay | Admitting: Obstetrics

## 2018-04-04 NOTE — H&P (Signed)
Heidi Hardin is a 32 y.o. G1P0 at [redacted]w[redacted]d presenting for IOL due to marginal CI and term gestation. Pt notes no contractions but some cramping . Good fetal movement, though less than prior,  No vaginal bleeding, not leaking fluid.  PNCare at [facility] since 8 wks - Dated by LMP c/w 8 wk u/s  - Marginal cord insertion, normal fetal growth, last at 39 wks, 6'11, 37%,  - h/o depression, no current meds - GBS neg   Prenatal Transfer Tool  Maternal Diabetes: No Genetic Screening: Normal Maternal Ultrasounds/Referrals: Normal Fetal Ultrasounds or other Referrals:  None Maternal Substance Abuse:  No Significant Maternal Medications:  None Significant Maternal Lab Results: None     OB History    Gravida  1   Para      Term      Preterm      AB      Living        SAB      TAB      Ectopic      Multiple      Live Births             Past Medical History:  Diagnosis Date  . Anxiety    Past Surgical History:  Procedure Laterality Date  . LAPAROTOMY Right 02/18/2017   Procedure: EXPLORATORY LAPAROTOMY RIGHT SALPINGOOOPHORECTOMY AND PELVIC WASHINGS;  Surgeon: Heidi Evans, MD;  Location: [facility];  Service: Gynecology;  Laterality: Right;  pelvic washings and removal right S&O   . MOUTH SURGERY  2000   Family History: family history includes Cancer in her father, paternal aunt, and paternal grandmother. Social History:  reports that she has never smoked. She has never used smokeless tobacco. She reports that she does not drink alcohol or use drugs.  Review of Systems - Negative except mild discomfort of pregnancy     There were no vitals taken for this visit.  Physical Exam: (exam done in office pm 04/04/18) Gen: well appearing, no distress CV: RRR Pulm: CTAB Back: no CVAT Abd: gravid, NT, no RUQ pain LE: no edema, equal bilaterally, non-tender Toco: irritibility FH: baseline 140s, accelerations present, no deceleratons, 10 beat variability  Prenatal  labs: ABO, Rh: B/Positive/-- (04/11 0000) Antibody: n (04/11 0000) Rubella: Immune (04/11 0000) RPR: Nonreactive (04/11 0000)  HBsAg: Negative (04/11 0000)  HIV: Non-reactive (04/11 0000)  GBS: Negative (10/08 0000)  1 hr Glucola 116  Genetic screening nl NT, nl AFP Anatomy US nl   Assessment/Plan: 32 y.o. G1P0 at [redacted]w[redacted]d Marginal CI, term gestation. Plan IOL. cytotec x 2 overnight then plan pitocin. Possible foley bulb and AROM. Pt aware indications for IOL and R/B. Time expectations reviewed w/ pt - GBS neg - watch for PP depression   Heidi Hardin 04/04/2018, 9:43 PM

## 2018-04-05 ENCOUNTER — Inpatient Hospital Stay (HOSPITAL_COMMUNITY)
Admission: RE | Admit: 2018-04-05 | Discharge: 2018-04-07 | DRG: 807 | Disposition: A | Discharge status: Home / Self Care | Payer: PRIVATE HEALTH INSURANCE | Attending: Obstetrics | Admitting: Obstetrics

## 2018-04-05 ENCOUNTER — Inpatient Hospital Stay (HOSPITAL_COMMUNITY): Payer: PRIVATE HEALTH INSURANCE | Admitting: Anesthesiology

## 2018-04-05 ENCOUNTER — Encounter (HOSPITAL_COMMUNITY): Payer: Self-pay

## 2018-04-05 ENCOUNTER — Inpatient Hospital Stay (HOSPITAL_COMMUNITY): Admit: 2018-04-05 | Payer: Self-pay

## 2018-04-05 DIAGNOSIS — Z3A40 40 weeks gestation of pregnancy: Secondary | ICD-10-CM

## 2018-04-05 DIAGNOSIS — Z3490 Encounter for supervision of normal pregnancy, unspecified, unspecified trimester: Secondary | ICD-10-CM | POA: Diagnosis present

## 2018-04-05 DIAGNOSIS — O43123 Velamentous insertion of umbilical cord, third trimester: Principal | ICD-10-CM | POA: Diagnosis present

## 2018-04-05 LAB — TYPE AND SCREEN
ABO/RH(D): B POS
ANTIBODY SCREEN: NEGATIVE

## 2018-04-05 LAB — CBC
HCT: 38.9 % (ref 36.0–46.0)
HEMOGLOBIN: 13.5 g/dL (ref 12.0–15.0)
MCH: 30.5 pg (ref 26.0–34.0)
MCHC: 34.7 g/dL (ref 30.0–36.0)
MCV: 87.8 fL (ref 80.0–100.0)
NRBC: 0 % (ref 0.0–0.2)
Platelets: 183 10*3/uL (ref 150–400)
RBC: 4.43 MIL/uL (ref 3.87–5.11)
RDW: 13.6 % (ref 11.5–15.5)
WBC: 13.6 10*3/uL — AB (ref 4.0–10.5)

## 2018-04-05 MED ORDER — TERBUTALINE SULFATE 1 MG/ML IJ SOLN
0.2500 mg | Freq: Once | INTRAMUSCULAR | Status: DC | PRN
  Filled 2018-04-05: qty 1

## 2018-04-05 MED ORDER — IBUPROFEN 600 MG PO TABS
600.0000 mg | ORAL_TABLET | Freq: Four times a day (QID) | ORAL | Status: DC
  Administered 2018-04-05 – 2018-04-07 (×7): 600 mg via ORAL
  Filled 2018-04-05 (×7): qty 1

## 2018-04-05 MED ORDER — PHENYLEPHRINE 40 MCG/ML (10ML) SYRINGE FOR IV PUSH (FOR BLOOD PRESSURE SUPPORT)
80.0000 ug | PREFILLED_SYRINGE | INTRAVENOUS | Status: DC | PRN
  Filled 2018-04-05: qty 5
  Filled 2018-04-05: qty 10

## 2018-04-05 MED ORDER — OXYCODONE HCL 5 MG PO TABS: 5.0000 mg | ORAL_TABLET | ORAL | Status: DC | PRN

## 2018-04-05 MED ORDER — LACTATED RINGERS IV SOLN
INTRAVENOUS | Status: DC
  Administered 2018-04-05: 01:00:00 via INTRAVENOUS

## 2018-04-05 MED ORDER — FENTANYL CITRATE (PF) 100 MCG/2ML IJ SOLN
50.0000 ug | INTRAMUSCULAR | Status: DC | PRN
  Administered 2018-04-05: 50 ug via INTRAVENOUS
  Administered 2018-04-05: 100 ug via INTRAVENOUS
  Filled 2018-04-05 (×2): qty 2

## 2018-04-05 MED ORDER — DIPHENHYDRAMINE HCL 25 MG PO CAPS: 25.0000 mg | ORAL_CAPSULE | Freq: Four times a day (QID) | ORAL | Status: DC | PRN

## 2018-04-05 MED ORDER — EPHEDRINE 5 MG/ML INJ
10.0000 mg | INTRAVENOUS | Status: DC | PRN
  Filled 2018-04-05: qty 2

## 2018-04-05 MED ORDER — OXYTOCIN BOLUS FROM INFUSION
500.0000 mL | Freq: Once | INTRAVENOUS | Status: AC
  Administered 2018-04-05: 500 mL via INTRAVENOUS

## 2018-04-05 MED ORDER — ONDANSETRON HCL 4 MG/2ML IJ SOLN: 4.0000 mg | INTRAMUSCULAR | Status: DC | PRN

## 2018-04-05 MED ORDER — BENZOCAINE-MENTHOL 20-0.5 % EX AERO
1.0000 "application " | INHALATION_SPRAY | CUTANEOUS | Status: DC | PRN
  Filled 2018-04-05: qty 56

## 2018-04-05 MED ORDER — PRENATAL MULTIVITAMIN CH
1.0000 | ORAL_TABLET | Freq: Every day | ORAL | Status: DC
  Administered 2018-04-06: 1 via ORAL
  Filled 2018-04-05: qty 1

## 2018-04-05 MED ORDER — FENTANYL 2.5 MCG/ML BUPIVACAINE 1/10 % EPIDURAL INFUSION (WH - ANES)
14.0000 mL/h | INTRAMUSCULAR | Status: DC | PRN
  Administered 2018-04-05: 14 mL/h via EPIDURAL
  Filled 2018-04-05: qty 100

## 2018-04-05 MED ORDER — ONDANSETRON HCL 4 MG/2ML IJ SOLN: 4.0000 mg | Freq: Four times a day (QID) | INTRAMUSCULAR | Status: DC | PRN

## 2018-04-05 MED ORDER — ZOLPIDEM TARTRATE 5 MG PO TABS: 5.0000 mg | ORAL_TABLET | Freq: Every evening | ORAL | Status: DC | PRN

## 2018-04-05 MED ORDER — SIMETHICONE 80 MG PO CHEW: 80.0000 mg | CHEWABLE_TABLET | ORAL | Status: DC | PRN

## 2018-04-05 MED ORDER — LIDOCAINE HCL (PF) 1 % IJ SOLN
INTRAMUSCULAR | Status: DC | PRN
  Administered 2018-04-05 (×2): 6 mL via EPIDURAL

## 2018-04-05 MED ORDER — LACTATED RINGERS IV SOLN: 500.0000 mL | Freq: Once | INTRAVENOUS | Status: DC

## 2018-04-05 MED ORDER — LACTATED RINGERS IV SOLN
500.0000 mL | INTRAVENOUS | Status: DC | PRN
  Administered 2018-04-05: 500 mL via INTRAVENOUS

## 2018-04-05 MED ORDER — ACETAMINOPHEN 325 MG PO TABS: 650.0000 mg | ORAL_TABLET | ORAL | Status: DC | PRN

## 2018-04-05 MED ORDER — TETANUS-DIPHTH-ACELL PERTUSSIS 5-2.5-18.5 LF-MCG/0.5 IM SUSP: 0.5000 mL | Freq: Once | INTRAMUSCULAR | Status: DC

## 2018-04-05 MED ORDER — OXYTOCIN 40 UNITS IN LACTATED RINGERS INFUSION - SIMPLE MED
2.5000 [IU]/h | INTRAVENOUS | Status: DC
  Filled 2018-04-05: qty 1000

## 2018-04-05 MED ORDER — WITCH HAZEL-GLYCERIN EX PADS: 1.0000 "application " | MEDICATED_PAD | CUTANEOUS | Status: DC | PRN

## 2018-04-05 MED ORDER — SENNOSIDES-DOCUSATE SODIUM 8.6-50 MG PO TABS
2.0000 | ORAL_TABLET | ORAL | Status: DC
  Administered 2018-04-05 – 2018-04-07 (×2): 2 via ORAL
  Filled 2018-04-05 (×2): qty 2

## 2018-04-05 MED ORDER — OXYCODONE HCL 5 MG PO TABS: 10.0000 mg | ORAL_TABLET | ORAL | Status: DC | PRN

## 2018-04-05 MED ORDER — PHENYLEPHRINE 40 MCG/ML (10ML) SYRINGE FOR IV PUSH (FOR BLOOD PRESSURE SUPPORT)
80.0000 ug | PREFILLED_SYRINGE | INTRAVENOUS | Status: DC | PRN
  Filled 2018-04-05: qty 5

## 2018-04-05 MED ORDER — ONDANSETRON HCL 4 MG PO TABS: 4.0000 mg | ORAL_TABLET | ORAL | Status: DC | PRN

## 2018-04-05 MED ORDER — COCONUT OIL OIL
1.0000 "application " | TOPICAL_OIL | Status: DC | PRN
  Administered 2018-04-06: 1 via TOPICAL
  Filled 2018-04-05: qty 120

## 2018-04-05 MED ORDER — DIBUCAINE 1 % RE OINT: 1.0000 "application " | TOPICAL_OINTMENT | RECTAL | Status: DC | PRN

## 2018-04-05 MED ORDER — LIDOCAINE HCL (PF) 1 % IJ SOLN
30.0000 mL | INTRAMUSCULAR | Status: DC | PRN
  Filled 2018-04-05: qty 30

## 2018-04-05 MED ORDER — DIPHENHYDRAMINE HCL 50 MG/ML IJ SOLN: 12.5000 mg | INTRAMUSCULAR | Status: DC | PRN

## 2018-04-05 MED ORDER — MISOPROSTOL 25 MCG QUARTER TABLET
25.0000 ug | ORAL_TABLET | ORAL | Status: DC | PRN
  Administered 2018-04-05 (×2): 25 ug via VAGINAL
  Filled 2018-04-05 (×2): qty 1

## 2018-04-05 MED ORDER — SOD CITRATE-CITRIC ACID 500-334 MG/5ML PO SOLN: 30.0000 mL | ORAL | Status: DC | PRN

## 2018-04-05 NOTE — H&P (Signed)
Heidi Hardin is a 32 y.o. G1P0 at [redacted]w[redacted]d presenting for IOL due to marginal CI and term gestation. Pt notes no contractions but some cramping . Good fetal movement, though less than prior,  No vaginal bleeding, not leaking fluid.  PNCare at [facility] since 8 wks - Dated by LMP c/w 8 wk u/s  - Marginal cord insertion, normal fetal growth, last at 39 wks, 6'11, 37%,  - h/o depression, no current meds - GBS neg   Prenatal Transfer Tool  Maternal Diabetes: No Genetic Screening: Normal Maternal Ultrasounds/Referrals: Normal Fetal Ultrasounds or other Referrals:  None Maternal Substance Abuse:  No Significant Maternal Medications:  None Significant Maternal Lab Results: None     OB History    Gravida  1   Para      Term      Preterm      AB      Living        SAB      TAB      Ectopic      Multiple      Live Births             Past Medical History:  Diagnosis Date  . Anxiety    Past Surgical History:  Procedure Laterality Date  . LAPAROTOMY Right 02/18/2017   Procedure: EXPLORATORY LAPAROTOMY RIGHT SALPINGOOOPHORECTOMY AND PELVIC WASHINGS;  Surgeon: Shea Evans, MD;  Location: [facility];  Service: Gynecology;  Laterality: Right;  pelvic washings and removal right S&O   . MOUTH SURGERY  2000   Family History: family history includes Cancer in her father, paternal aunt, and paternal grandmother. Social History:  reports that she has never smoked. She has never used smokeless tobacco. She reports that she does not drink alcohol or use drugs.  Review of Systems - Negative except mild discomfort of pregnancy   Dilation: 10 Effacement (%): 80 Station: Plus 2 Exam by:: M Sigmon CNM Blood pressure 120/65, pulse 84, temperature 98.3 F (36.8 C), temperature source Oral, resp. rate 16, height 5\' 3"  (1.6 m), weight 90.8 kg, SpO2 99 %.  Physical Exam: (exam done in office pm 04/04/18) Gen: well appearing, no distress CV: RRR Pulm: CTAB Back: no CVAT Abd:  gravid, NT, no RUQ pain LE: no edema, equal bilaterally, non-tender Toco: irritibility FH: baseline 140s, accelerations present, no deceleratons, 10 beat variability  Prenatal labs: ABO, Rh: --/--/B POS (11/06 0102) Antibody: NEG (11/06 0102) Rubella: Immune (04/11 0000) RPR: Nonreactive (04/11 0000)  HBsAg: Negative (04/11 0000)  HIV: Non-reactive (04/11 0000)  GBS: Negative (10/08 0000)  1 hr Glucola 116  Genetic screening nl NT, nl AFP Anatomy US nl   Assessment/Plan: 32 y.o. G1P0 at [redacted]w[redacted]d Marginal CI, term gestation. Plan IOL. cytotec x 2 overnight then plan pitocin. Possible foley bulb and AROM. Pt aware indications for IOL and R/B. Time expectations reviewed w/ pt - GBS neg - watch for PP depression   Lendon Colonel 04/05/2018, 3:12 PM

## 2018-04-05 NOTE — Anesthesia Preprocedure Evaluation (Signed)
Anesthesia Evaluation  Patient identified by MRN, date of birth, ID band Patient awake    Reviewed: Allergy & Precautions, H&P , NPO status , Patient's Chart, lab work & pertinent test results  Airway Mallampati: I  TM Distance: >3 FB Neck ROM: full    Dental no notable dental hx.    Pulmonary neg pulmonary ROS,    Pulmonary exam normal breath sounds clear to auscultation       Cardiovascular negative cardio ROS   Rhythm:regular Rate:Normal     Neuro/Psych negative neurological ROS  negative psych ROS   GI/Hepatic negative GI ROS, Neg liver ROS,   Endo/Other  negative endocrine ROS  Renal/GU negative Renal ROS  negative genitourinary   Musculoskeletal negative musculoskeletal ROS (+)   Abdominal (+) + obese,   Peds  Hematology negative hematology ROS (+)   Anesthesia Other Findings   Reproductive/Obstetrics (+) Pregnancy                             Anesthesia Physical Anesthesia Plan  ASA: II  Anesthesia Plan: Epidural   Post-op Pain Management:    Induction:   PONV Risk Score and Plan:   Airway Management Planned:   Additional Equipment:   Intra-op Plan:   Post-operative Plan:   Informed Consent: I have reviewed the patients History and Physical, chart, labs and discussed the procedure including the risks, benefits and alternatives for the proposed anesthesia with the patient or authorized representative who has indicated his/her understanding and acceptance.       Plan Discussed with:   Anesthesia Plan Comments:         Anesthesia Quick Evaluation  

## 2018-04-05 NOTE — Anesthesia Procedure Notes (Signed)
Epidural Patient location during procedure: OB Start time: 04/05/2018 11:48 AM End time: 04/05/2018 11:52 AM  Staffing Anesthesiologist: Leilani Able, MD Performed: anesthesiologist   Preanesthetic Checklist Completed: patient identified, site marked, surgical consent, pre-op evaluation, timeout performed, IV checked, risks and benefits discussed and monitors and equipment checked  Epidural Patient position: sitting Prep: site prepped and draped and DuraPrep Patient monitoring: continuous pulse ox and blood pressure Approach: midline Location: L3-L4 Injection technique: LOR air  Needle:  Needle type: Tuohy  Needle gauge: 17 G Needle length: 9 cm and 9 Needle insertion depth: 6 cm Catheter type: closed end flexible Catheter size: 19 Gauge Catheter at skin depth: 11 cm Test dose: negative and Other  Assessment Sensory level: T10 Events: blood not aspirated, injection not painful, no injection resistance, negative IV test and no paresthesia  Additional Notes Reason for block:procedure for pain

## 2018-04-05 NOTE — Lactation Note (Signed)
This note was copied from a baby's chart. Lactation Consultation Note  Patient Name: Heidi Hardin [id number] Date: 04/05/2018 Reason for consult: Initial assessment;Term;Primapara;1st time breastfeeding  Baby is 3 hours old  LC updated the doc flow sheets/  As LC entered the room, baby latched with depth in the cross cradle with multiple swallows/  FISH lips/ and swallows increased with breast compressions. When baby released the nipple was slightly slanted and noted some areolas edema with semi compressible areolas Indicating the use shells  Between feedings except when sleeping and after breast massage/ hand express/ pre-pump if needed. ( Due to a room of company at the end to the consult , LC Brought in hand pump , shells, and comfort gels and asked the [facility] to instruct mom on the use of all 3 tools ). ' LC reviewed hand expressing and encouraged mom to use her EBM to nipples liberally.  Will F/U in the am .  Per mom  has DEBP Medela at home.  Mother informed of post-discharge support and given phone number to the lactation department, including services for phone call assistance; out-patient appointments; and breastfeeding support group. List of other breastfeeding resources in the community given in the handout. Encouraged mother to call for problems or concerns related to breastfeeding.     Maternal Data Has patient been taught Hand Expression?: Yes(per mom was shown by the [facility] ) Does the patient have breastfeeding experience prior to this delivery?: No  Feeding ( Baby was already latched - LC assessed - deep latch, mom comfortable )  Feeding Type: (baby latched with depth / multiple swallows )  LATCH Score Latch: Too sleepy or reluctant, no latch achieved, no sucking elicited.  Multiple swallows noted , and LC showed mom breast compressions , increased swallows.  Type of Nipple: Flat  Comfort (Breast/Nipple): Soft / non-tender  Interventions Interventions: Breast feeding  basics reviewed;Skin to skin  Lactation Tools Discussed/Used WIC Program: No   Consult Status Consult Status: Follow-up Date: 04/06/18 Follow-up type: In-patient    Matilde Sprang Monia Timmers 04/05/2018, 6:02 PM

## 2018-04-05 NOTE — Anesthesia Pain Management Evaluation Note (Signed)
  CRNA Pain Management Visit Note  Patient: Heidi Hardin, 32 y.o., female  "Hello I am a member of the anesthesia team at [facility]. We have an anesthesia team available at all times to provide care throughout the hospital, including epidural management and anesthesia for C-section. I don't know your plan for the delivery whether it a natural birth, water birth, IV sedation, nitrous supplementation, doula or epidural, but we want to meet your pain goals."   1.Was your pain managed to your expectations on prior hospitalizations?   No prior hospitalizations  2.What is your expectation for pain management during this hospitalization?     Epidural  3.How can we help you reach that goal? epidural  Record the patient's initial score and the patient's pain goal.   Pain: 3  Pain Goal: 5 [facility] wants you to be able to say your pain was always managed very well.  Britt Theard 04/05/2018

## 2018-04-05 NOTE — Progress Notes (Signed)
I was asked by Dr. Ernestina Penna to assess patient for intracervical balloon Pt. Is a G1P0 at 40 weeks presenting for IOL at midnight for marginal cord insertion.  She is s/p 2 doses of Cytotec vaginally.   S: Pt. Is breathing well through contractions, but feels like they are becoming more intense. Rating pain a 4/10. Discussed intracervical balloon and pt. Agrees with plan. Wants to discuss pain management options.   O:  VS: Blood pressure 111/68, pulse 65, temperature 98.3 F (36.8 C), temperature source Oral, resp. rate 16, height 5\' 3"  (1.6 m), weight 90.8 kg.        FHR : baseline 135 bpm/ variability moderate / accelerations +15x15 / no decelerations - variable decels were noted earlier, but resolved with position changes and IVF bolus         Toco: contractions every 1-2 minutes / moderate        Cervix : Dilation: 1.5 Effacement (%): 70 Cervical Position: Posterior Station: -1 Presentation: Vertex Exam by:: Henessy Rohrer, CNM        Membranes: intact  A: Induction of labor - latent phase     Marginal cord insertion     FHR category 1      GBS negative  P: Intracervical balloon successfully placed      Apply traction to balloon every 1 hour       Fentanyl 51mcg-100mcg IVP every 1 hour PRN for pain scale >4      Anticipate NSVD    Carlean Jews, MSN, CNM [facility]

## 2018-04-05 NOTE — Progress Notes (Signed)
S:  Intracervical balloon out. Pt. Requesting epidural   O:  VS: Blood pressure 114/71, pulse 63, temperature 98.3 F (36.8 C), temperature source Oral, resp. rate 16, height 5\' 3"  (1.6 m), weight 90.8 kg.        FHR : baseline 130 bpm / variability moderate / accelerations +15x15 / variable decelerations and occasional late decelerations to nadir of 60bpm, now has resolved with IVF bolus and position changes        Toco: contractions every 1-2 minutes / moderate-strong         Cervix : 4.5cm/80%/-1/vtx per RN        Membranes: Intact  A: Latent labor     FHR category 2, with progression to Cat 1    GBS negative  P: Improved fetal tracing, likely due to tachysystole     Do not start Pitocin     Okay for epidural now     Continue close monitoring    Anticipate NSVD  Dr. Ernestina Penna updated with status and plan     Carlean Jews, MSN, CNM [facility]

## 2018-04-06 LAB — RPR: RPR: NONREACTIVE

## 2018-04-06 NOTE — Lactation Note (Signed)
This note was copied from a baby's chart. Lactation Consultation Note Baby 50 hrs old. Baby has no interest in BF at this time. Baby last fed at 0100. Baby choking and thrashing around in bed. LC picked baby up. No emesis noted. Baby turned dark red then cleared. LC attempted to suction mouth w/bulb syring, a little bubbles noted. Baby sounded very congested after choking. No mucous visible in nares. RN notified. Mom has flat nipple, full feeling breast. Mom is able to massage and compress a little. Nipples have erythemas bilaterally as well as on some of the areolas. Mom wearing comfort gels. Encouraged mom to wear shells today. Suggested NS to obtain deeper latch. Mom stated latching caused so much pain that she dreaded it. Mom has small nipples. #16 Ns fits good. #20 NS slightly larger. Mom has both at bedside. Explained will have to try them both out to see which one feels the best and has the most transfer. Mom wanted to try to latch the baby w/NS. LC explained to mom the baby will not want to eat after having several choking spells. Mom wanted to try. Baby very sleepy, didn't want to feed. Wouldn't suckle on gloved finger. Noted high palate. Mom demonstrated several times application of NS and using "C" hold to firm breast for latching w/NS.  Mom has hand pump to pre-pump prior to application of NS. LC took DEBP kit into rm. DEBP at bedside. Discussed pumping d/t NS. Mom in agreement. Asked RN to set up.  Suggested to mom that she holds the baby STS since he didn't feed. Mom in agreement. Newborn behavior, STS, I&O, cluster feeding, supply and demand discussed. Mom encouraged to feed baby 8-12 times/24 hours and with feeding cues. Encouraged to call for assistance or questions.   [place] brochure given w/resources, support groups and [facility] services.  Patient Name: Heidi Hardin [id number] Date: 04/06/2018 Reason for consult: Initial assessment;1st time breastfeeding   Maternal Data Does the patient  have breastfeeding experience prior to this delivery?: No  Feeding    LATCH Score Latch: Too sleepy or reluctant, no latch achieved, no sucking elicited.     Type of Nipple: Flat  Comfort (Breast/Nipple): Filling, red/small blisters or bruises, mild/mod discomfort  Hold (Positioning): Full assist, staff holds infant at breast     Interventions Interventions: Breast feeding basics reviewed;Support pillows;Assisted with latch;Position options;Skin to skin;Breast massage;Hand express;Shells;Pre-pump if needed;Comfort gels;Hand pump;Breast compression;Adjust position  Lactation Tools Discussed/Used Tools: Shells;Pump;Comfort gels;Nipple Shields Nipple shield size: 16;20 Shell Type: Inverted Breast pump type: Manual WIC Program: No   Consult Status Consult Status: Follow-up Date: 04/06/18 Follow-up type: In-patient    Theodoro Kalata 04/06/2018, 5:26 AM

## 2018-04-06 NOTE — Anesthesia Postprocedure Evaluation (Signed)
Anesthesia Post Note  Patient: Heidi Hardin  Procedure(s) Performed: AN AD HOC LABOR EPIDURAL     Patient location during evaluation: Mother Baby Anesthesia Type: Epidural Level of consciousness: awake, awake and alert and oriented Vital Signs Assessment: post-procedure vital signs reviewed and stable Respiratory status: spontaneous breathing, nonlabored ventilation and respiratory function stable Cardiovascular status: stable Postop Assessment: no headache, no backache, no apparent nausea or vomiting, adequate PO intake, able to ambulate and patient able to bend at knees Anesthetic complications: no    Last Vitals:  Vitals:   04/06/18 0247 04/06/18 0600  BP: 115/69 108/80  Pulse: 70 76  Resp: 16 16  Temp: 36.6 C 36.7 C  SpO2: 97% 98%    Last Pain:  Vitals:   04/06/18 0600  TempSrc: Oral  PainSc: 0-No pain   Pain Goal: Patients Stated Pain Goal: 3 (04/05/18 0041)               Gregg Winchell

## 2018-04-06 NOTE — Progress Notes (Signed)
[name] was referred for history of depression/anxiety. * Referral screened out by Clinical Social Worker because none of the following criteria appear to apply: ~ History of anxiety/depression during this pregnancy, or of post-partum depression following prior delivery. ~ Diagnosis of anxiety and/or depression within last 3 years OR * [name]'s symptoms currently being treated with medication and/or therapy. Please contact the Clinical Social Worker if needs arise, by [name] request, or if [name] scores greater than 9/yes to question 10 on Edinburgh Postpartum Depression Screen.  Heidi Zettler, LCSW Clinical Social Worker  [facility]  [phone]  

## 2018-04-06 NOTE — Lactation Note (Signed)
This note was copied from a baby's chart. Lactation Consultation Note  Patient Name: Heidi Hardin [id number] Date: 04/06/2018   Mom tender with bruising on left and right nipple, compression make on left.  LC worked with mom on hand expressing.  After trying a few times mom was able to easily hand express colostrum.  LC colleted 2 ml in spoon and finger fed to infant with gloved finger.    Infant just returned from circumcision and would not wake to feed.  Several different positions tried on both breasts along with waking techniques but infant would not wake.    LC encouraged mom to hand express and more containers were given to collect; mom notices increase in the ease of colostrum and amount when expressing.    Mom has a hand pump to prepump prior to latching infant.  She also has DEBP and flange size feels  Comfortable to mom.   NS size 16 appears too small and edges line up pressing on bruises on nipple.  LC recommended to not use the 16 due to possible more irritation to the nipple.   She has a 20 NS but has not tried it.  Mom prefers not to use the shield and her breast tissue is compressible and nipple everts with expression but shaft is short.  LC teaches mom and dad teacup hold and suggests to try that with next feed.  Dad knows to help sandwich tissue until infant establishes a good suck rhythm.  LC encouraged to call out when infant wakes to feed.  Mom is reassured with colostrum flow and LC encourages her to hand express and pre pump prior to latch.    Comfort gels are used for tenderness at this time.  Family will call out to latch assist or further questions or concerns.    Maternal Data    Feeding Feeding Type: Breast Fed  LATCH Score                   Interventions    Lactation Tools Discussed/Used     Consult Status      Heidi Hardin [facility] 04/06/2018, 1:57 PM

## 2018-04-06 NOTE — Progress Notes (Signed)
Post Partum Day 1 S/P induced vaginal  Feeding: breast  Subjective: No HA, SOB, CP, F/C, breast symptoms. Normal vaginal bleeding, no clots. Pain controlled.  ambulating without symptoms.  voiding without difficulty    Objective: BP 108/80   Pulse 76   Temp 98 F (36.7 C) (Oral)   Resp 16   Ht 5\' 3"  (1.6 m)   Wt 90.8 kg   SpO2 98%   Breastfeeding? Unknown   BMI 35.46 kg/m  I&O reviewed.   Physical Exam:  General: alert and cooperative Lochia: appropriate Uterine Fundus: firm DVT Evaluation: No evidence of DVT seen on physical exam. Ext: No c/c/e Recent Labs    04/05/18 0100  HGB 13.5  HCT 38.9      Assessment/Plan: 32 y.o.  PPD #1 .  normal postpartum exam Induction of labor at 40 weeks for marginal cord insertion.  Induction with Cytotec x2 and cervical Foley balloon then spontaneous labor progressed. Continue current postpartum care Ambulate Consent for circumcision.  Plan to do later today   LOS: 1 day   Lendon Colonel 04/06/2018 8:51 AM

## 2018-04-07 MED ORDER — IBUPROFEN 600 MG PO TABS: 600.0000 mg | ORAL_TABLET | Freq: Four times a day (QID) | ORAL | 0 refills | Status: DC

## 2018-04-07 NOTE — Progress Notes (Signed)
PPD 2 SVD with 2nd degree repair  S:  Reports feeling tired  - not much sleep             Tolerating po/ No nausea or vomiting             Bleeding is light, moderate             Pain controlled with motrin             Up ad lib / ambulatory / voiding QS  Newborn Breast  O:    VS: BP 129/83 (BP Location: Right Arm)   Pulse 62   Temp 98.2 F (36.8 C) (Oral)   Resp 17   Ht 5\' 3"  (1.6 m)   Wt 90.8 kg   SpO2 97%   Breastfeeding? Unknown   BMI 35.46 kg/m    LABS:             Recent Labs    04/05/18 0100  WBC 13.6*  HGB 13.5  PLT 183               Blood type: --/--/B POS (11/06 0102)  Rubella: Immune (04/11 0000)               Flu and tdap current 2019                        Physical Exam:             Alert and oriented X3  Abdomen: soft, non-tender, non-distended              Fundus: firm, non-tender, Ueven   A: PPD # 2 with 2nd degree repair  doing well - stable status  P: Routine post partum orders  DC home  Marlinda Mike CNM, MSN, [facility] 04/07/2018, 9:09 AM

## 2018-04-07 NOTE — Discharge Summary (Signed)
Obstetric Discharge Summary Reason for Admission: induction of labor - marginal cord insertion Prenatal Procedures: ultrasound Intrapartum Procedures: spontaneous vaginal delivery Postpartum Procedures: none Complications-Operative and Postpartum: 2nd degree perineal laceration Hemoglobin  Date Value Ref Range Status  04/05/2018 13.5 12.0 - 15.0 g/dL Final   HCT  Date Value Ref Range Status  04/05/2018 38.9 36.0 - 46.0 % Final    Physical Exam:  General: alert, cooperative and no distress Lochia: appropriate Uterine Fundus: firm Incision: healing well DVT Evaluation: No evidence of DVT seen on physical exam.  Discharge Diagnoses: Term Pregnancy-delivered  Discharge Information: Date: 04/07/2018 Activity: pelvic rest Diet: routine Medications: PNV and Ibuprofen Condition: stable Instructions: refer to practice specific booklet Discharge to: home Follow-up Information    Karena Addison, CNM. Schedule an appointment as soon as possible for a visit in 6 week(s).   Specialty:  Obstetrics and Gynecology Contact information: [address] [place] [address] [phone]           Newborn Data: Live born female  Birth Weight: 7 lb 2.6 oz (3250 g) APGAR: 9, 9  Newborn Delivery   Birth date/time:  04/05/2018 14:50:00 Delivery type:  Vaginal, Spontaneous     Home with mother.  Marlinda Mike 04/07/2018, 9:12 AM

## 2018-04-07 NOTE — Lactation Note (Signed)
This note was copied from a baby's chart. Lactation Consultation Note  Patient Name: Heidi Hardin Date: 04/07/2018 Reason for consult: Follow-up assessment;Difficult latch;1st time breastfeeding;Primapara;Term  P1 mother whose infant is now 28 hours old  Mother has not been successful at latching baby onto the right breast.  She had requested latch assistance.    Mother's breasts are bruised in a couple of small areas due to previous poor latches.  Mother's breasts are soft and non tender and nipples are short shafted bilaterally.  Mother did hand expression and was able to obtain a few drops of colostrum which I finger fed back to baby.  She also used the DEBP to pre-pump to assist with nipple eversion prior to latching.  Assisted to latch in the football hold on the right breast.  After a couple of attempts baby latched and took a couple of sucks but self released.  Burped baby and tried again with minimal sucking noted.  Suggested mother try the cross cradle position on the same breast.  After a couple of attempts baby was able to latch and sustained a good rhythmic suck for 15 minutes.  Mother denied pain with latching.  She stated that it gave her reassurance that she could breast feed her baby.  After 15 minutes he self released and was sleepy and relaxed.  Mother continue to hold him STS.  Provided a second pack of comfort gels for home use.  Reminded mother to obtain a deep latch and to keep practicing with latching to the right breast.  Offered our OP services as needed.  She has the phone number.  Parents very appreciative of help and are ready for discharge.  RN updated.   Maternal Data Formula Feeding for Exclusion: No Has patient been taught Hand Expression?: Yes Does the patient have breastfeeding experience prior to this delivery?: No  Feeding Feeding Type: Breast Fed  LATCH Score Latch: Grasps breast easily, tongue down, lips flanged, rhythmical  sucking.  Audible Swallowing: Spontaneous and intermittent  Type of Nipple: Everted at rest and after stimulation  Comfort (Breast/Nipple): Soft / non-tender  Hold (Positioning): Assistance needed to correctly position infant at breast and maintain latch.  LATCH Score: 9  Interventions Interventions: Breast feeding basics reviewed;Assisted with latch;Skin to skin;Breast massage;Hand express;Pre-pump if needed;Expressed milk;Position options;Support pillows;Adjust position;Breast compression;Comfort gels;Hand pump  Lactation Tools Discussed/Used Tools: Pump;Comfort gels WIC Program: No   Consult Status Consult Status: Complete Date: 04/07/18 Follow-up type: Call as needed    Khris Jansson R Sylvan Sookdeo 04/07/2018, 10:40 AM

## 2018-04-07 NOTE — Lactation Note (Signed)
This note was copied from a baby's chart. Lactation Consultation Note  Patient Name: Boy Talia Hoheisel [id number] Date: 04/07/2018   [facility] Visit:  Mother concerned about not being able to latch on the right breast; will call me for next feeding and I will complete the feeding assessment at that time.     Consult Status      Nyeli Holtmeyer R Blaise Grieshaber 04/07/2018, 8:17 AM

## 2019-03-23 ENCOUNTER — Other Ambulatory Visit: Payer: Self-pay

## 2019-03-23 DIAGNOSIS — Z20822 Contact with and (suspected) exposure to covid-19: Secondary | ICD-10-CM

## 2019-03-24 LAB — NOVEL CORONAVIRUS, NAA: SARS-CoV-2, NAA: NOT DETECTED

## 2019-04-16 ENCOUNTER — Other Ambulatory Visit: Payer: Self-pay

## 2019-04-16 DIAGNOSIS — Z20822 Contact with and (suspected) exposure to covid-19: Secondary | ICD-10-CM

## 2019-04-18 LAB — NOVEL CORONAVIRUS, NAA: SARS-CoV-2, NAA: NOT DETECTED

## 2020-01-08 LAB — OB RESULTS CONSOLE HEPATITIS B SURFACE ANTIGEN: Hepatitis B Surface Ag: NEGATIVE

## 2020-01-08 LAB — OB RESULTS CONSOLE ANTIBODY SCREEN: Antibody Screen: NEGATIVE

## 2020-01-08 LAB — OB RESULTS CONSOLE RUBELLA ANTIBODY, IGM: Rubella: IMMUNE

## 2020-01-08 LAB — OB RESULTS CONSOLE GC/CHLAMYDIA
Chlamydia: NEGATIVE
Gonorrhea: NEGATIVE

## 2020-01-08 LAB — OB RESULTS CONSOLE RPR: RPR: NONREACTIVE

## 2020-01-08 LAB — OB RESULTS CONSOLE ABO/RH: RH Type: POSITIVE

## 2020-01-08 LAB — OB RESULTS CONSOLE HIV ANTIBODY (ROUTINE TESTING): HIV: NONREACTIVE

## 2020-05-08 ENCOUNTER — Other Ambulatory Visit: Payer: PRIVATE HEALTH INSURANCE

## 2020-05-08 DIAGNOSIS — Z20822 Contact with and (suspected) exposure to covid-19: Secondary | ICD-10-CM

## 2020-05-09 LAB — SARS-COV-2, NAA 2 DAY TAT

## 2020-05-09 LAB — NOVEL CORONAVIRUS, NAA: SARS-CoV-2, NAA: NOT DETECTED

## 2020-05-31 NOTE — L&D Delivery Note (Signed)
Delivery Note At 10:05 PM a viable and healthy female was delivered via Vaginal, Spontaneous (Presentation:   Occiput Anterior).  APGAR: 8, 9 ; weight pending .   Placenta status: Spontaneous, Intact.  Cord: 3 vessels with the following complications: Loose nuchal cord, released over the head after head delivery  Cord pH: NA  Anesthesia: Epidural Episiotomy:  None Lacerations: 1st degree;Perineal Suture Repair: 3.0 vicryl rapide Est. Blood Loss (mL): 50  Mom to postpartum.  Baby to Couplet care / Skin to Skin.  Robley Fries 07/29/2020, 10:54 PM

## 2020-07-17 ENCOUNTER — Telehealth (HOSPITAL_COMMUNITY): Payer: Self-pay | Admitting: *Deleted

## 2020-07-17 ENCOUNTER — Encounter (HOSPITAL_COMMUNITY): Payer: Self-pay | Admitting: *Deleted

## 2020-07-17 NOTE — Telephone Encounter (Signed)
Preadmission screen  

## 2020-07-25 ENCOUNTER — Other Ambulatory Visit: Payer: Self-pay | Admitting: Obstetrics & Gynecology

## 2020-07-28 ENCOUNTER — Other Ambulatory Visit (HOSPITAL_COMMUNITY)
Admission: RE | Admit: 2020-07-28 | Discharge: 2020-07-28 | Disposition: A | Discharge status: Home / Self Care | Payer: PRIVATE HEALTH INSURANCE | Source: Ambulatory Visit | Attending: Obstetrics & Gynecology | Admitting: Obstetrics & Gynecology

## 2020-07-28 DIAGNOSIS — Z20822 Contact with and (suspected) exposure to covid-19: Secondary | ICD-10-CM | POA: Insufficient documentation

## 2020-07-28 DIAGNOSIS — Z01812 Encounter for preprocedural laboratory examination: Secondary | ICD-10-CM | POA: Insufficient documentation

## 2020-07-28 LAB — SARS CORONAVIRUS 2 (TAT 6-24 HRS): SARS Coronavirus 2: NEGATIVE

## 2020-07-29 ENCOUNTER — Inpatient Hospital Stay (HOSPITAL_COMMUNITY): Payer: PRIVATE HEALTH INSURANCE | Admitting: Anesthesiology

## 2020-07-29 ENCOUNTER — Inpatient Hospital Stay (HOSPITAL_COMMUNITY): Admit: 2020-07-29 | Payer: Self-pay

## 2020-07-29 ENCOUNTER — Inpatient Hospital Stay (HOSPITAL_COMMUNITY)
Admission: AD | Admit: 2020-07-29 | Discharge: 2020-07-31 | DRG: 807 | Disposition: A | Discharge status: Home / Self Care | Payer: PRIVATE HEALTH INSURANCE | Attending: Obstetrics & Gynecology | Admitting: Obstetrics & Gynecology

## 2020-07-29 ENCOUNTER — Encounter (HOSPITAL_COMMUNITY): Payer: Self-pay | Admitting: Obstetrics & Gynecology

## 2020-07-29 ENCOUNTER — Inpatient Hospital Stay (HOSPITAL_COMMUNITY): Payer: PRIVATE HEALTH INSURANCE

## 2020-07-29 ENCOUNTER — Other Ambulatory Visit: Payer: Self-pay

## 2020-07-29 DIAGNOSIS — Z3490 Encounter for supervision of normal pregnancy, unspecified, unspecified trimester: Secondary | ICD-10-CM | POA: Diagnosis present

## 2020-07-29 DIAGNOSIS — O99344 Other mental disorders complicating childbirth: Secondary | ICD-10-CM | POA: Diagnosis present

## 2020-07-29 DIAGNOSIS — F32A Depression, unspecified: Secondary | ICD-10-CM | POA: Diagnosis present

## 2020-07-29 DIAGNOSIS — Z3A39 39 weeks gestation of pregnancy: Secondary | ICD-10-CM | POA: Diagnosis not present

## 2020-07-29 DIAGNOSIS — Z20822 Contact with and (suspected) exposure to covid-19: Secondary | ICD-10-CM | POA: Diagnosis present

## 2020-07-29 DIAGNOSIS — O26893 Other specified pregnancy related conditions, third trimester: Secondary | ICD-10-CM | POA: Diagnosis present

## 2020-07-29 LAB — CBC
HCT: 36.6 % (ref 36.0–46.0)
Hemoglobin: 12.7 g/dL (ref 12.0–15.0)
MCH: 30 pg (ref 26.0–34.0)
MCHC: 34.7 g/dL (ref 30.0–36.0)
MCV: 86.5 fL (ref 80.0–100.0)
Platelets: 158 10*3/uL (ref 150–400)
RBC: 4.23 MIL/uL (ref 3.87–5.11)
RDW: 13.9 % (ref 11.5–15.5)
WBC: 14.2 10*3/uL — ABNORMAL HIGH (ref 4.0–10.5)
nRBC: 0 % (ref 0.0–0.2)

## 2020-07-29 LAB — RPR: RPR Ser Ql: NONREACTIVE

## 2020-07-29 LAB — TYPE AND SCREEN
ABO/RH(D): B POS
Antibody Screen: NEGATIVE

## 2020-07-29 MED ORDER — LACTATED RINGERS IV SOLN
500.0000 mL | INTRAVENOUS | Status: DC | PRN
  Administered 2020-07-29: 350 mL via INTRAVENOUS

## 2020-07-29 MED ORDER — ACETAMINOPHEN 325 MG PO TABS: 650.0000 mg | ORAL_TABLET | ORAL | Status: DC | PRN

## 2020-07-29 MED ORDER — OXYTOCIN-SODIUM CHLORIDE 30-0.9 UT/500ML-% IV SOLN
2.5000 [IU]/h | INTRAVENOUS | Status: DC
  Administered 2020-07-29: 2.5 [IU]/h via INTRAVENOUS

## 2020-07-29 MED ORDER — LACTATED RINGERS IV SOLN: 500.0000 mL | Freq: Once | INTRAVENOUS | Status: DC

## 2020-07-29 MED ORDER — ONDANSETRON HCL 4 MG/2ML IJ SOLN: 4.0000 mg | Freq: Four times a day (QID) | INTRAMUSCULAR | Status: DC | PRN

## 2020-07-29 MED ORDER — PHENYLEPHRINE 40 MCG/ML (10ML) SYRINGE FOR IV PUSH (FOR BLOOD PRESSURE SUPPORT)
80.0000 ug | PREFILLED_SYRINGE | INTRAVENOUS | Status: DC | PRN
  Filled 2020-07-29: qty 10

## 2020-07-29 MED ORDER — PHENYLEPHRINE 40 MCG/ML (10ML) SYRINGE FOR IV PUSH (FOR BLOOD PRESSURE SUPPORT): 80.0000 ug | PREFILLED_SYRINGE | INTRAVENOUS | Status: DC | PRN

## 2020-07-29 MED ORDER — DIPHENHYDRAMINE HCL 50 MG/ML IJ SOLN: 12.5000 mg | INTRAMUSCULAR | Status: DC | PRN

## 2020-07-29 MED ORDER — LIDOCAINE HCL (PF) 1 % IJ SOLN: 30.0000 mL | INTRAMUSCULAR | Status: DC | PRN

## 2020-07-29 MED ORDER — FENTANYL-BUPIVACAINE-NACL 0.5-0.125-0.9 MG/250ML-% EP SOLN
12.0000 mL/h | EPIDURAL | Status: DC | PRN
Start: 2020-07-29 — End: 2020-07-30
  Administered 2020-07-29: 12 mL/h via EPIDURAL
  Filled 2020-07-29: qty 250

## 2020-07-29 MED ORDER — OXYTOCIN BOLUS FROM INFUSION
333.0000 mL | Freq: Once | INTRAVENOUS | Status: AC
  Administered 2020-07-29: 333 mL via INTRAVENOUS

## 2020-07-29 MED ORDER — EPHEDRINE 5 MG/ML INJ: 10.0000 mg | INTRAVENOUS | Status: DC | PRN

## 2020-07-29 MED ORDER — LACTATED RINGERS IV SOLN: INTRAVENOUS | Status: DC

## 2020-07-29 MED ORDER — LIDOCAINE HCL (PF) 1 % IJ SOLN
INTRAMUSCULAR | Status: DC | PRN
  Administered 2020-07-29 (×2): 4 mL via EPIDURAL

## 2020-07-29 MED ORDER — SOD CITRATE-CITRIC ACID 500-334 MG/5ML PO SOLN: 30.0000 mL | ORAL | Status: DC | PRN

## 2020-07-29 MED ORDER — TERBUTALINE SULFATE 1 MG/ML IJ SOLN: 0.2500 mg | Freq: Once | INTRAMUSCULAR | Status: DC | PRN

## 2020-07-29 MED ORDER — LACTATED RINGERS AMNIOINFUSION: INTRAVENOUS | Status: DC

## 2020-07-29 MED ORDER — OXYTOCIN-SODIUM CHLORIDE 30-0.9 UT/500ML-% IV SOLN
1.0000 m[IU]/min | INTRAVENOUS | Status: DC
  Administered 2020-07-29: 2 m[IU]/min via INTRAVENOUS
  Filled 2020-07-29: qty 500

## 2020-07-29 NOTE — Anesthesia Preprocedure Evaluation (Signed)
Anesthesia Evaluation  Patient identified by MRN, date of birth, ID band Patient awake    Reviewed: Allergy & Precautions, Patient's Chart, lab work & pertinent test results  Airway Mallampati: II  TM Distance: >3 FB Neck ROM: Full    Dental no notable dental hx. (+) Teeth Intact   Pulmonary neg pulmonary ROS,    Pulmonary exam normal breath sounds clear to auscultation       Cardiovascular negative cardio ROS Normal cardiovascular exam Rhythm:Regular Rate:Normal     Neuro/Psych Anxiety negative neurological ROS     GI/Hepatic Neg liver ROS, GERD  ,  Endo/Other  Obesity  Renal/GU negative Renal ROS  negative genitourinary   Musculoskeletal negative musculoskeletal ROS (+)   Abdominal (+) + obese,   Peds  Hematology negative hematology ROS (+)   Anesthesia Other Findings   Reproductive/Obstetrics (+) Pregnancy                             Anesthesia Physical Anesthesia Plan  ASA: II  Anesthesia Plan: Epidural   Post-op Pain Management:    Induction:   PONV Risk Score and Plan:   Airway Management Planned: Natural Airway  Additional Equipment:   Intra-op Plan:   Post-operative Plan:   Informed Consent: I have reviewed the patients History and Physical, chart, labs and discussed the procedure including the risks, benefits and alternatives for the proposed anesthesia with the patient or authorized representative who has indicated his/her understanding and acceptance.       Plan Discussed with: Anesthesiologist  Anesthesia Plan Comments:         Anesthesia Quick Evaluation

## 2020-07-29 NOTE — Progress Notes (Signed)
Pt assess due to repetitive variable decels.   Objective: BP 112/72   Pulse 84   Temp 97.9 F (36.6 C) (Oral)   Resp 16   Ht 5\' 3"  (1.6 m)   Wt 82.2 kg   SpO2 100%   BMI 32.10 kg/m    FHR: 120 bpm, variability: moderate,  accelerations:  Present,  decelerations: repetitive- to 60s and return to baseline in 30-45 sec- cat II - cord pattern UC:   regular, every 2-3 minutes SVE:   Dilation: 7 Effacement (%): 80 Station: -2 Exam by:: Dr [email or web address] Head entering pelvis, loosely applied to cervix, no palpable cord on exam. Small caput noted. Baby not tolerating either lateral position. FSE, IUPC placed. Amnioinfusion ordered.    Assessment / Plan: Elective IOL at term. Now with repetitive variable decels, cat II, pitocin stopped and amnioinfusion helped. FHT back to Category I but UCs spaced out. Restart pitocin at 4 mu and maintain q3 min UCs pattern  Labor: Progressing normally Preeclampsia:  NA Fetal Wellbeing:  Category I Pain Control:  Epidural I/D:  n/a Anticipated MOD:  NSVD  Juliene Pina 07/29/2020, 7:20 PM

## 2020-07-29 NOTE — Anesthesia Procedure Notes (Signed)
Epidural Patient location during procedure: OB Start time: 07/29/2020 12:43 PM End time: 07/29/2020 1:50 PM  Staffing Anesthesiologist: Mal Amabile, MD  Preanesthetic Checklist Completed: patient identified, IV checked, site marked, risks and benefits discussed, surgical consent, monitors and equipment checked, pre-op evaluation and timeout performed  Epidural Patient position: sitting Prep: DuraPrep and site prepped and draped Patient monitoring: continuous pulse ox and blood pressure Approach: midline Location: L3-L4 Injection technique: LOR air  Needle:  Needle type: Tuohy  Needle gauge: 17 G Needle length: 9 cm and 9 Needle insertion depth: 5 cm cm Catheter type: closed end flexible Catheter size: 19 Gauge Catheter at skin depth: 10 cm Test dose: negative and Other  Assessment Events: blood not aspirated, injection not painful, no injection resistance, no paresthesia and negative IV test  Additional Notes Patient identified. Risks and benefits discussed including failed block, incomplete  Pain control, post dural puncture headache, nerve damage, paralysis, blood pressure Changes, nausea, vomiting, reactions to medications-both toxic and allergic and post Partum back pain. All questions were answered. Patient expressed understanding and wished to proceed. Sterile technique was used throughout procedure. Epidural site was Dressed with sterile barrier dressing. No paresthesias, signs of intravascular injection Or signs of intrathecal spread were encountered.  Patient was more comfortable after the epidural was dosed. Please see RN's note for documentation of vital signs and FHR which are stable. Reason for block:procedure for pain

## 2020-07-29 NOTE — Lactation Note (Addendum)
This note was copied from a baby's chart. Lactation Consultation Note  Patient Name: Boy Ceairra Mccarver [id number] Date: 07/29/2020 Reason for consult: L&D Initial assessment;Term Age:35 hours  ( L&D- LC no charge) P2, term female infant. Mom latched infant on her right breast using the football hold position, infant breastfeed for 17 minutes. Mom hand expressed 5 mls of colostrum that was spoon fed to infant. Mom knows to breastfeed infant according to primal cues: licking , kissing, tasting, rooting and hands in mouth, STS. Mom knows to call RN or LC on MBU if she needs further assistance with latching infant at the breast.  LC discussed infant's input and output with parents. LC will set up hand pump in mom's room ( 411)  on MBU mom will pre-pump breast prior to latching infant due to having flat nipples. LC will informed MBU ( RN) mom receiving hand pump and advise how to use. Maternal Data Has patient been taught Hand Expression?: Yes Does the patient have breastfeeding experience prior to this delivery?: Yes How long did the patient breastfeed?: Per mom, she NF her 1st child who is 2 years for 10 months, infant did not latch well she mostly pumped.  Feeding Mother's Current Feeding Choice: Breast Milk  LATCH Score Latch: Grasps breast easily, tongue down, lips flanged, rhythmical sucking.  Audible Swallowing: A few with stimulation  Type of Nipple: Flat  Comfort (Breast/Nipple): Soft / non-tender  Hold (Positioning): Assistance needed to correctly position infant at breast and maintain latch.  LATCH Score: 7   Lactation Tools Discussed/Used    Interventions Interventions: Breast feeding basics reviewed;Assisted with latch;Adjust position;Breast compression;Skin to skin;Support pillows;Breast massage;Position options;Hand express;Expressed milk;Pre-pump if needed;Hand pump  Discharge Pump: Manual;Personal WIC Program: No  Consult Status Consult Status: Follow-up Date:  07/30/20 Follow-up type: In-patient    Danelle Earthly 07/29/2020, 11:18 PM

## 2020-07-29 NOTE — H&P (Signed)
Heidi Hardin is a 35 y.o. female presenting for IOL at 39 wks  G2P1001. Uncomplicated pregnancy. PNcare from 7 wks.   Low weight gain. Growth at 34 wks AGA 30%, 5 lbs, Vx, AFI nl.   1st kid- 7'2.6" SVD.   OB History    Gravida  2   Para  1   Term  1   Preterm      AB      Living  1     SAB      IAB      Ectopic      Multiple  0   Live Births  1          Past Medical History:  Diagnosis Date  . Anxiety    Past Surgical History:  Procedure Laterality Date  . LAPAROTOMY Right 02/18/2017   Procedure: EXPLORATORY LAPAROTOMY RIGHT SALPINGOOOPHORECTOMY AND PELVIC WASHINGS;  Surgeon: Shea Evans, MD;  Location: [facility];  Service: Gynecology;  Laterality: Right;  pelvic washings and removal right S&O   . MOUTH SURGERY  2000   Family History: family history includes Cancer in her father, paternal aunt, and paternal grandmother. Social History:  reports that she has never smoked. She has never used smokeless tobacco. She reports that she does not drink alcohol and does not use drugs.     Maternal Diabetes: No Genetic Screening: Normal Maternal Ultrasounds/Referrals: Normal Fetal Ultrasounds or other Referrals:  None Maternal Substance Abuse:  No Significant Maternal Medications:  None Significant Maternal Lab Results:  Group B Strep negative Other Comments:  None  Review of Systems History Dilation: 1.5 Effacement (%): 50 Station: -2 Exam by:: S Nix RN Blood pressure 102/79, pulse 74, temperature 97.9 F (36.6 C), temperature source Oral, resp. rate 16, height 5\' 3"  (1.6 m), weight 82.2 kg, unknown if currently breastfeeding. Exam Physical Exam  Physical exam:  A&O x 3, no acute distress. Pleasant HEENT neg, no thyromegaly Lungs CTA bilat CV RRR, S1S2 normal Abdo soft, non tender, non acute Extr no edema/ tenderness Pelvic above FHT 140s + accels no decels mod variab- cat I Toco q 2-3 min, pitocin  Prenatal labs: ABO, Rh: --/--/B POS (03/01  0753) Antibody: NEG (03/01 0753) Rubella: Immune (08/10 0000) RPR: NON REACTIVE (03/01 0723)  HBsAg: Negative (08/10 0000)  HIV: Non-reactive (08/10 0000)  GBS:   Neg (07/10/2020)   Assessment/Plan:  35 yo G2P1001, 39 wks, IOL for dates. EFW 7 lbs, pitocin IOL. Anticipate SVD.  Epidural per pt  FHT cat I  20 07/29/2020, 12:52 PM

## 2020-07-29 NOTE — Progress Notes (Addendum)
Heidi Hardin is a 35 y.o. G2P1001 at [redacted]w[redacted]d by ultrasound admitted for induction of labor due to Elective at term.  Subjective: No complaints. Epidural working  Objective: BP 115/75   Pulse 68   Temp 97.9 F (36.6 C) (Oral)   Resp 16   Ht 5\' 3"  (1.6 m)   Wt 82.2 kg   SpO2 100%   BMI 32.10 kg/m  No intake/output data recorded. No intake/output data recorded.  FHT:  FHR: 120 bpm, variability: moderate,  accelerations:  Present,  decelerations:  Absent UC:   regular, every 2-3 minutes SVE:   Dilation: 2 Effacement (%): 80 Station: -3 Exam by:: Foley,rnc AROM. Clear fluid. Head not well applied, plan to sit up in bed and assess engagement in 1 hr   Labs: Lab Results  Component Value Date   WBC 14.2 (H) 07/29/2020   HGB 12.7 07/29/2020   HCT 36.6 07/29/2020   MCV 86.5 07/29/2020   PLT 158 07/29/2020    Assessment / Plan: Induction of labor due to elective at term,  progressing well on pitocin, early labor   Labor: Progressing normally Preeclampsia:  NA Fetal Wellbeing:  Category I Pain Control:  Epidural I/D:  n/a Anticipated MOD:  NSVD  09/28/2020 07/29/2020, 2:23 PM

## 2020-07-29 NOTE — Progress Notes (Signed)
IOL for dates, prior 7'2" SVD  Amnioinfusion and stopping pitocin for FHT recovery back to cat I helped. Back on pitocin at low dose. No pelvic pressure FHT best without variable decels in upright position sitting up straight. Pt comfortable and baby tolerating best.  Per RN 9.5 cm and stn at 0 +1 Reassess in 1 hr, will push only after station is low since variables were severe earlier on and not sure how well baby will tolerate pushing

## 2020-07-30 LAB — CBC
HCT: 34.5 % — ABNORMAL LOW (ref 36.0–46.0)
Hemoglobin: 11.7 g/dL — ABNORMAL LOW (ref 12.0–15.0)
MCH: 29.7 pg (ref 26.0–34.0)
MCHC: 33.9 g/dL (ref 30.0–36.0)
MCV: 87.6 fL (ref 80.0–100.0)
Platelets: 147 10*3/uL — ABNORMAL LOW (ref 150–400)
RBC: 3.94 MIL/uL (ref 3.87–5.11)
RDW: 14 % (ref 11.5–15.5)
WBC: 15 10*3/uL — ABNORMAL HIGH (ref 4.0–10.5)
nRBC: 0 % (ref 0.0–0.2)

## 2020-07-30 MED ORDER — IBUPROFEN 600 MG PO TABS
600.0000 mg | ORAL_TABLET | Freq: Four times a day (QID) | ORAL | Status: DC
  Administered 2020-07-30 – 2020-07-31 (×6): 600 mg via ORAL
  Filled 2020-07-30 (×6): qty 1

## 2020-07-30 MED ORDER — ONDANSETRON HCL 4 MG PO TABS: 4.0000 mg | ORAL_TABLET | ORAL | Status: DC | PRN

## 2020-07-30 MED ORDER — WITCH HAZEL-GLYCERIN EX PADS: 1.0000 "application " | MEDICATED_PAD | CUTANEOUS | Status: DC | PRN

## 2020-07-30 MED ORDER — SENNOSIDES-DOCUSATE SODIUM 8.6-50 MG PO TABS
2.0000 | ORAL_TABLET | Freq: Every day | ORAL | Status: DC
  Administered 2020-07-30 – 2020-07-31 (×2): 2 via ORAL
  Filled 2020-07-30 (×2): qty 2

## 2020-07-30 MED ORDER — COCONUT OIL OIL: 1.0000 "application " | TOPICAL_OIL | Status: DC | PRN

## 2020-07-30 MED ORDER — DIBUCAINE (PERIANAL) 1 % EX OINT: 1.0000 "application " | TOPICAL_OINTMENT | CUTANEOUS | Status: DC | PRN

## 2020-07-30 MED ORDER — DIPHENHYDRAMINE HCL 25 MG PO CAPS: 25.0000 mg | ORAL_CAPSULE | Freq: Four times a day (QID) | ORAL | Status: DC | PRN

## 2020-07-30 MED ORDER — SIMETHICONE 80 MG PO CHEW: 80.0000 mg | CHEWABLE_TABLET | ORAL | Status: DC | PRN

## 2020-07-30 MED ORDER — ACETAMINOPHEN 325 MG PO TABS
650.0000 mg | ORAL_TABLET | ORAL | Status: DC | PRN
Start: 2020-07-30 — End: 2020-07-31

## 2020-07-30 MED ORDER — ONDANSETRON HCL 4 MG/2ML IJ SOLN: 4.0000 mg | INTRAMUSCULAR | Status: DC | PRN

## 2020-07-30 MED ORDER — BENZOCAINE-MENTHOL 20-0.5 % EX AERO
1.0000 "application " | INHALATION_SPRAY | CUTANEOUS | Status: DC | PRN
  Administered 2020-07-30: 1 via TOPICAL
  Filled 2020-07-30: qty 56

## 2020-07-30 MED ORDER — PRENATAL MULTIVITAMIN CH
1.0000 | ORAL_TABLET | Freq: Every day | ORAL | Status: DC
  Administered 2020-07-30: 1 via ORAL
  Filled 2020-07-30: qty 1

## 2020-07-30 MED ORDER — ZOLPIDEM TARTRATE 5 MG PO TABS
5.0000 mg | ORAL_TABLET | Freq: Every evening | ORAL | Status: DC | PRN
Start: 2020-07-30 — End: 2020-07-31

## 2020-07-30 MED ORDER — TETANUS-DIPHTH-ACELL PERTUSSIS 5-2.5-18.5 LF-MCG/0.5 IM SUSY: 0.5000 mL | PREFILLED_SYRINGE | Freq: Once | INTRAMUSCULAR | Status: DC

## 2020-07-30 NOTE — Lactation Note (Signed)
This note was copied from a baby's chart. Lactation Consultation Note  Patient Name: Heidi Hardin [id number] Date: 07/30/2020   Age:35 hours P2, term female infant. Per dad, infant had 7 episodes of emesis earlier today but is starting to improve with feedings. Infant had 4 stools and 2 voids since birth. LC did not observe latch, mom had  finished breastfeeding infant for 20 minutes prior to [name] entering the room. Mom is feeling more confident now that infant is starting to latch well at the breast. RN observed and document recent latch. Mom doesn't have any questions or concerns for LC at this time.  Mom will continue to breastfeed infant according to cues, 8 to 12= or more times within 24 hours, STS. Maternal Data    Feeding    LATCH Score                    Lactation Tools Discussed/Used    Interventions    Discharge    Consult Status      Heidi Hardin 07/30/2020, 5:51 PM

## 2020-07-30 NOTE — Anesthesia Postprocedure Evaluation (Signed)
Anesthesia Post Note  Patient: Heidi Hardin  Procedure(s) Performed: AN AD HOC LABOR EPIDURAL     Patient location during evaluation: Mother Baby Anesthesia Type: Epidural Level of consciousness: awake Pain management: satisfactory to patient Vital Signs Assessment: post-procedure vital signs reviewed and stable Respiratory status: spontaneous breathing Cardiovascular status: stable Anesthetic complications: no   No complications documented.  Last Vitals:  Vitals:   07/30/20 0123 07/30/20 0503  BP: 116/77 118/78  Pulse: 64 75  Resp: 17 18  Temp: 36.9 C 36.9 C  SpO2: 97% 96%    Last Pain:  Vitals:   07/30/20 0503  TempSrc: Oral  PainSc: 2    Pain Goal:                   Cephus Shelling

## 2020-07-30 NOTE — Progress Notes (Signed)
PPD # 1 S/P NSVD  Live born female  Birth Weight: 6 lb 10.7 oz (3025 g) APGAR: 8, 9  Newborn Delivery   Birth date/time: 07/29/2020 22:05:00 Delivery type: Vaginal, Spontaneous     Baby name: Heidi Hardin Delivering provider: MODY, VAISHALI  Episiotomy:   Lacerations:1st degree;Perineal   circumcision pending  Feeding: breast  Pain control at delivery: Epidural   S:  Reports feeling well.             Tolerating po/ No nausea or vomiting             Bleeding is light             Pain controlled with ibuprofen (OTC)             Up ad lib / ambulatory / voiding without difficulties   O:  A & O x 3, in no apparent distress              VS:  Vitals:   07/30/20 0020 07/30/20 0123 07/30/20 0503 07/30/20 0854  BP: 110/75 116/77 118/78 110/72  Pulse: 73 64 75 68  Resp: 18 17 18 18   Temp: 98.4 F (36.9 C) 98.4 F (36.9 C) 98.4 F (36.9 C) 98.3 F (36.8 C)  TempSrc: Oral Oral Oral Oral  SpO2: 97% 97% 96% 98%  Weight:      Height:        LABS:  Recent Labs    07/29/20 0723 07/30/20 0519  WBC 14.2* 15.0*  HGB 12.7 11.7*  HCT 36.6 34.5*  PLT 158 147*    Blood type: --/--/B POS (03/01 0753)  Rubella: Immune (08/10 0000)   I&O: I/O last 3 completed shifts: In: -  Out: 1150 [Urine:1100; Blood:50]          No intake/output data recorded.  Vaccines: TDaP          UTD         Flu             UTD                    COVID-19 UTD  Gen: AAO x 3, NAD  Abdomen: soft, non-tender, non-distended             Fundus: firm, non-tender, U-@  Perineum: intact repair, mild bruising  Lochia: small  Extremities: no edema, no calf pain or tenderness    A/P: PPD # 1 34 y.o., 01-02-1984   Active Problems:   Encounter for induction of labor   Doing well - stable status  Routine post partum orders  Anticipate discharge tomorrow    [id number], MSN, CNM 07/30/2020, 9:47 AM

## 2020-07-30 NOTE — Social Work (Signed)
[name] was referred for history of depression.   * Referral screened out by Clinical Social Worker because none of the following criteria appear to apply:  ~ History of anxiety/depression during this pregnancy, or of post-partum depression following prior delivery. ~ Diagnosis of anxiety and/or depression within last 3 years. Prenatal records note a date of 2018. OR * [name]'s symptoms currently being treated with medication and/or therapy. Per chart review, [name] has an active prescription for Wellbutrin 150mg.  [name] scored a 4 on Edinburgh Postpartum Depression Screen.  Please contact the Clinical Social Worker if needs arise or by [name] request.  Heidi Hardin, LCSWA Clinical Social Work [facility]  [phone] 

## 2020-07-31 MED ORDER — COCONUT OIL OIL: 1.0000 "application " | TOPICAL_OIL | 0 refills | Status: AC | PRN

## 2020-07-31 MED ORDER — BENZOCAINE-MENTHOL 20-0.5 % EX AERO: 1.0000 "application " | INHALATION_SPRAY | CUTANEOUS | Status: AC | PRN

## 2020-07-31 MED ORDER — IBUPROFEN 600 MG PO TABS: 600.0000 mg | ORAL_TABLET | Freq: Four times a day (QID) | ORAL | 0 refills | Status: AC

## 2020-07-31 NOTE — Progress Notes (Signed)
PPD #2, SVD, first degree perineal repair, baby boy "[place]"  S:  Reports feeling overall well, c/o cough overnight with occasional bloody sputum because of strong cough. Reports her 35yo recently had pink eye and upper respiratory cold. States she has had cold symptoms for the past 2 weeks, but feels like it was exacerbated overnight. Declines need for abx.              Tolerating po/ No nausea or vomiting / Denies dizziness or SOB             Bleeding is light             Pain controlled withMotrin             Up ad lib / ambulatory / voiding QS without difficulty   Newborn breast feeding - going well  / Circumcision - completed this morning  O:               VS: BP 125/86 (BP Location: Right Arm)   Pulse 71   Temp 98.2 F (36.8 C) (Oral)   Resp 18   Ht 5\' 3"  (1.6 m)   Wt 82.2 kg   SpO2 98%   Breastfeeding Unknown   BMI 32.10 kg/m   Patient Vitals for the past 24 hrs:  BP Temp Temp src Pulse Resp SpO2  07/31/20 0608 125/86 98.2 F (36.8 C) Oral 71 18 98 %  07/30/20 1500 104/73 -- -- 72 17 --     LABS:             Recent Labs    07/29/20 0723 07/30/20 0519  WBC 14.2* 15.0*  HGB 12.7 11.7*  PLT 158 147*               Blood type: --/--/B POS (03/01 0753)  Rubella: Immune (08/10 0000)                     I&O: Intake/Output      03/02 0701 03/03 0700 03/03 0701 03/04 0700   Urine (mL/kg/hr)     Blood     Total Output     Net                        Physical Exam:             Alert and oriented X3  Lungs: Clear and unlabored  Heart: regular rate and rhythm / no murmurs  Abdomen: soft, non-tender, non-distended              Fundus: firm, non-tender, U-E (bladder full)  Perineum: well approximated 1st degree, no edema  Lochia: small lochia on pad   Extremities: no edema, no calf pain or tenderness    A: PPD # 1, SVD  Hx. Of depression   - stable on Wellbutrin   - Signs of PPD/anxiety reviewed   Doing well - stable status  Routine post partum  orders  Discharge home today  Discussed Mucinex or Delsym PRN for cough/neti pot/cool mist humidifier; call for worsening s/s  F/u in 6 weeks with Dr. 05/04, MSN, CNM [facility]

## 2020-07-31 NOTE — Discharge Summary (Addendum)
Postpartum Discharge Summary  Date of Service updated 07/31/2020     Patient Name: Heidi Hardin DOB: 1985/08/08 MRN: [id number]  Date of admission: 07/29/2020 Delivery date:07/29/2020  Delivering provider: MODY, VAISHALI  Date of discharge: 07/31/2020  Admitting diagnosis: Encounter for induction of labor [Z34.90] SVD (spontaneous vaginal delivery) [O80] Intrauterine pregnancy: [redacted]w[redacted]d    Secondary diagnosis:  Principal Problem:   Postpartum care following vaginal delivery 3/1 Active Problems:   SVD (spontaneous vaginal delivery)   Encounter for induction of labor   Perineal laceration with delivery, first degree  Additional problems: Hx. Of depression    Discharge diagnosis: Term Pregnancy Delivered                                              Post partum procedures:n/a Augmentation: AROM and Pitocin Complications: None  Hospital course: Induction of Labor With Vaginal Delivery   35y.o. yo G2P2002 at 343w0das admitted to the hospital 07/29/2020 for induction of labor.  Indication for induction: Favorable cervix at term.  Patient had an uncomplicated labor course as follows: Membrane Rupture Time/Date: 1:50 PM ,07/29/2020   Delivery Method:Vaginal, Spontaneous  Episiotomy:   Lacerations:  1st degree;Perineal  Details of delivery can be found in separate delivery note.  Patient had a routine postpartum course. Patient is discharged home 07/31/20.  Newborn Data: Birth date:07/29/2020  Birth time:10:05 PM  Gender:Female  Living status:Living  Apgars:8 ,9  Weight:3025 g  "Sully"  Magnesium Sulfate received: No BMZ received: No Rhophylac:N/A MMR:N/A T-DaP:Given prenatally Flu: Yes  Covid: UTD Transfusion:No  Physical exam  Vitals:   07/30/20 0503 07/30/20 0854 07/30/20 1500 07/31/20 0608  BP: 118/78 110/72 104/73 125/86  Pulse: 75 68 72 71  Resp: '18 18 17 18  ' Temp: 98.4 F (36.9 C) 98.3 F (36.8 C)  98.2 F (36.8 C)  TempSrc: Oral Oral  Oral  SpO2: 96% 98%  98%   Weight:      Height:       Alert and oriented X3             Lungs: Clear and unlabored             Heart: regular rate and rhythm / no murmurs             Abdomen: soft, non-tender, non-distended              Fundus: firm, non-tender, U-E (bladder full)             Perineum: well approximated 1st degree, no edema             Lochia: small lochia on pad              Extremities: no edema, no calf pain or tenderness Labs: Lab Results  Component Value Date   WBC 15.0 (H) 07/30/2020   HGB 11.7 (L) 07/30/2020   HCT 34.5 (L) 07/30/2020   MCV 87.6 07/30/2020   PLT 147 (L) 07/30/2020   CMP Latest Ref Rng & Units 02/16/2017  Glucose 65 - 99 mg/dL 112(H)  BUN 6 - 20 mg/dL 7  Creatinine 0.44 - 1.00 mg/dL 0.83  Sodium 135 - 145 mmol/L 139  Potassium 3.5 - 5.1 mmol/L 3.9  Chloride 101 - 111 mmol/L 107  CO2 22 - 32 mmol/L 23  Calcium 8.9 - 10.3 mg/dL 9.5  Total Protein 6.5 - 8.1 g/dL 7.5  Total Bilirubin 0.3 - 1.2 mg/dL 0.9  Alkaline Phos 38 - 126 U/L 67  AST 15 - 41 U/L 21  ALT 14 - 54 U/L 17   Edinburgh Score: Edinburgh Postnatal Depression Scale Screening Tool 07/30/2020  I have been able to laugh and see the funny side of things. 0  I have looked forward with enjoyment to things. 0  I have blamed myself unnecessarily when things went wrong. 1  I have been anxious or worried for no good reason. 1  I have felt scared or panicky for no good reason. 1  Things have been getting on top of me. 1  I have been so unhappy that I have had difficulty sleeping. 0  I have felt sad or miserable. 0  I have been so unhappy that I have been crying. 0  The thought of harming myself has occurred to me. 0  Edinburgh Postnatal Depression Scale Total 4      After visit meds:  Allergies as of 07/31/2020   No Known Allergies     Medication List    TAKE these medications   benzocaine-Menthol 20-0.5 % Aero Commonly known as: DERMOPLAST Apply 1 application topically as needed for irritation  (perineal discomfort).   buPROPion 150 MG 24 hr tablet Commonly known as: WELLBUTRIN XL Take 150 mg by mouth daily.   calcium carbonate 500 MG chewable tablet Commonly known as: TUMS - dosed in mg elemental calcium Chew 2 tablets by mouth 2 (two) times daily as needed for indigestion or heartburn.   coconut oil Oil Apply 1 application topically as needed.   diphenhydramine-acetaminophen 25-500 MG Tabs tablet Commonly known as: TYLENOL PM Take 2 tablets by mouth at bedtime as needed (For pain and sleep.).   ibuprofen 600 MG tablet Commonly known as: ADVIL Take 1 tablet (600 mg total) by mouth every 6 (six) hours.   prenatal multivitamin Tabs tablet Take 1 tablet by mouth daily at 12 noon.            Discharge Care Instructions  (From admission, onward)         Start     Ordered   07/31/20 0000  Discharge wound care:       Comments: Warm water sitz baths as needed   07/31/20 0854           Discharge home in stable condition Infant Feeding: Breast Infant Disposition:home with mother pending peds assessment  Discharge instruction: per After Visit Summary and Postpartum booklet. Activity: Advance as tolerated. Pelvic rest for 6 weeks.  Diet: low salt diet Anticipated Birth Control:  not discussed Postpartum Appointment:6 weeks Additional Postpartum F/U: Postpartum Depression checkup Future Appointments:No future appointments. Follow up Visit:  Follow-up Information    Azucena Fallen, MD. Schedule an appointment as soon as possible for a visit in 6 week(s).   Specialty: Obstetrics and Gynecology Why: Postpartum visit Contact information: [place] [id number] [phone]                   07/31/2020 Darliss Cheney, CNM
